# Patient Record
Sex: Male | Born: 1970 | Race: Black or African American | Hispanic: No | Marital: Married | Smoking: Current every day smoker
Health system: Southern US, Community
[De-identification: ages and names within clinical notes are randomized; demographics above are authoritative.]

## PROBLEM LIST (undated history)

## (undated) DIAGNOSIS — I10 Essential (primary) hypertension: Secondary | ICD-10-CM

## (undated) DIAGNOSIS — R7303 Prediabetes: Secondary | ICD-10-CM

## (undated) DIAGNOSIS — M51369 Other intervertebral disc degeneration, lumbar region without mention of lumbar back pain or lower extremity pain: Secondary | ICD-10-CM

## (undated) DIAGNOSIS — M1990 Unspecified osteoarthritis, unspecified site: Secondary | ICD-10-CM

## (undated) DIAGNOSIS — M5136 Other intervertebral disc degeneration, lumbar region: Secondary | ICD-10-CM

## (undated) DIAGNOSIS — E7800 Pure hypercholesterolemia, unspecified: Secondary | ICD-10-CM

## (undated) DIAGNOSIS — K635 Polyp of colon: Secondary | ICD-10-CM

## (undated) DIAGNOSIS — K219 Gastro-esophageal reflux disease without esophagitis: Secondary | ICD-10-CM

## (undated) HISTORY — PX: TOTAL HIP ARTHROPLASTY: SHX124

## (undated) HISTORY — PX: DRUG INDUCED ENDOSCOPY: SHX6808

## (undated) HISTORY — PX: COLONOSCOPY: SHX174

---

## 2019-01-31 ENCOUNTER — Emergency Department (HOSPITAL_COMMUNITY): Payer: Self-pay

## 2019-01-31 ENCOUNTER — Encounter (HOSPITAL_COMMUNITY): Payer: Self-pay

## 2019-01-31 ENCOUNTER — Emergency Department (HOSPITAL_COMMUNITY)
Admission: EM | Admit: 2019-01-31 | Discharge: 2019-01-31 | Disposition: A | Discharge status: Home / Self Care | Payer: Self-pay | Attending: Emergency Medicine | Admitting: Emergency Medicine

## 2019-01-31 ENCOUNTER — Other Ambulatory Visit: Payer: Self-pay

## 2019-01-31 DIAGNOSIS — Z8639 Personal history of other endocrine, nutritional and metabolic disease: Secondary | ICD-10-CM

## 2019-01-31 DIAGNOSIS — R0789 Other chest pain: Secondary | ICD-10-CM

## 2019-01-31 DIAGNOSIS — R072 Precordial pain: Secondary | ICD-10-CM

## 2019-01-31 LAB — CBC
HCT: 42.7 % (ref 39.0–52.0)
Hemoglobin: 14.4 g/dL (ref 13.0–17.0)
MCH: 32.5 pg (ref 26.0–34.0)
MCHC: 33.7 g/dL (ref 30.0–36.0)
MCV: 96.4 fL (ref 80.0–100.0)
Platelets: 150 10*3/uL (ref 150–400)
RBC: 4.43 MIL/uL (ref 4.22–5.81)
RDW: 13.2 % (ref 11.5–15.5)
WBC: 5 10*3/uL (ref 4.0–10.5)
nRBC: 0 % (ref 0.0–0.2)

## 2019-01-31 LAB — BASIC METABOLIC PANEL
Anion gap: 12 (ref 5–15)
BUN: 9 mg/dL (ref 6–20)
CO2: 22 mmol/L (ref 22–32)
CREATININE: 0.73 mg/dL (ref 0.61–1.24)
Calcium: 9.3 mg/dL (ref 8.9–10.3)
Chloride: 106 mmol/L (ref 98–111)
GFR calc Af Amer: >60 mL/min (ref >60)
GFR calc non Af Amer: >60 mL/min (ref >60)
Glucose, Bld: 100 mg/dL — ABNORMAL HIGH (ref 70–99)
Potassium: 3.7 mmol/L (ref 3.5–5.1)
SODIUM: 140 mmol/L (ref 135–145)

## 2019-01-31 LAB — I-STAT TROPONIN, ED: Troponin i, poc: 0 ng/mL (ref 0.00–0.08)

## 2019-01-31 MED ORDER — ALUM & MAG HYDROXIDE-SIMETH 200-200-20 MG/5ML PO SUSP
15.0000 mL | Freq: Once | ORAL | Status: AC
  Administered 2019-01-31: 15 mL via ORAL
  Filled 2019-01-31: qty 30

## 2019-01-31 MED ORDER — ACETAMINOPHEN 500 MG PO TABS
1000.0000 mg | ORAL_TABLET | Freq: Once | ORAL | Status: AC
  Administered 2019-01-31: 1000 mg via ORAL
  Filled 2019-01-31: qty 2

## 2019-01-31 MED ORDER — FAMOTIDINE 20 MG PO TABS
20.0000 mg | ORAL_TABLET | Freq: Once | ORAL | Status: AC
  Administered 2019-01-31: 20 mg via ORAL
  Filled 2019-01-31: qty 1

## 2019-01-31 MED ORDER — SODIUM CHLORIDE 0.9% FLUSH: 3.0000 mL | Freq: Once | INTRAVENOUS | Status: DC

## 2019-01-31 NOTE — Discharge Instructions (Addendum)
It was our pleasure to provide your ER care today - we hope that you feel better.  If gi symptoms/indigestion - try pepcid and maalox for symptom relief.  For chest discomfort, follow up with primary care doctor/cardiologist in the next 1-2 weeks - call office Monday to arrange follow up.  Return to ER if worse, new symptoms, high fevers, persistent/recurrent chest pain, difficulty breathing, other concern.

## 2019-01-31 NOTE — ED Provider Notes (Signed)
[facility] EMERGENCY DEPARTMENT Provider Note   CSN: [id number] Arrival date & time: 01/31/19  1615    History   Chief Complaint Chief Complaint  Patient presents with  . Chest Pain    HPI [facility] is a 48 y.o. male.     Patient c/o mid chest pain for the past 4 days. Pain constant, dull, moderate, gradual onset, constant, at rest, non radiating. Symptoms persistent/constant -  No change with activity or exertion. Not pleuritic. No associated nv, diaphoresis or sob. No cough or uri symptoms. No fever or chills. Denies personal or fam hx cad. No leg pain or swelling. +some indigestion. Denies unusual doe or fatigue. No chest wall injury or strain. No back or flank pain. No abd pain. No acute or abrupt change in pain today, but persistence of symptoms.   The history is provided by the patient.  Chest Pain  Associated symptoms: no abdominal pain, no back pain, no cough, no fever, no headache, no palpitations, no shortness of breath and no vomiting     Pmh: high cholesterol  fAm hxL: denies hx cad.   There are no active problems to display for this patient.   The histories are not reviewed yet. Please review them in the "History" navigator section and refresh this SmartLink.      Home Medications   Meds: none Prior to Admission medications   Not on File    Family History No family history on file.  Social History Social History   Tobacco Use  . Smoking status: Never Smoker  Substance Use Topics  . Alcohol use: Not on file  . Drug use: Not on file     Allergies   Tramadol   Review of Systems Review of Systems  Constitutional: Negative for fever.  HENT: Negative for sore throat.   Eyes: Negative for redness.  Respiratory: Negative for cough and shortness of breath.   Cardiovascular: Positive for chest pain. Negative for palpitations and leg swelling.  Gastrointestinal: Negative for abdominal pain and vomiting.  Genitourinary:  Negative for flank pain.  Musculoskeletal: Negative for back pain and neck pain.  Skin: Negative for rash.  Neurological: Negative for headaches.  Hematological: Does not bruise/bleed easily.  Psychiatric/Behavioral: Negative for confusion.     Physical Exam Updated Vital Signs BP 110/76 (BP Location: Right Arm)   Pulse 74   Temp 98.3 F (36.8 C) (Oral)   Resp 18   SpO2 99%   Physical Exam Vitals signs and nursing note reviewed.  Constitutional:      Appearance: Normal appearance. He is well-developed.  HENT:     Head: Atraumatic.     Nose: Nose normal.     Mouth/Throat:     Mouth: Mucous membranes are moist.     Pharynx: Oropharynx is clear.  Eyes:     General: No scleral icterus.    Conjunctiva/sclera: Conjunctivae normal.     Pupils: Pupils are equal, round, and reactive to light.  Neck:     Musculoskeletal: Normal range of motion and neck supple. No neck rigidity.     Trachea: No tracheal deviation.  Cardiovascular:     Rate and Rhythm: Normal rate and regular rhythm.     Pulses: Normal pulses.     Heart sounds: Normal heart sounds. No murmur. No friction rub. No gallop.   Pulmonary:     Effort: Pulmonary effort is normal. No accessory muscle usage or respiratory distress.     Breath sounds: Normal  breath sounds.  Chest:     Chest wall: No tenderness.  Abdominal:     General: Bowel sounds are normal. There is no distension.     Palpations: Abdomen is soft.     Tenderness: There is no abdominal tenderness. There is no guarding.  Genitourinary:    Comments: No cva tenderness. Musculoskeletal:        General: No swelling or tenderness.     Right lower leg: No edema.     Left lower leg: No edema.  Skin:    General: Skin is warm and dry.     Findings: No rash.  Neurological:     Mental Status: He is alert.     Comments: Alert, speech clear.   Psychiatric:        Mood and Affect: Mood normal.      ED Treatments / Results  Labs (all labs ordered are  listed, but only abnormal results are displayed) Results for orders placed or performed during the hospital encounter of 01/31/19  Basic metabolic panel  Result Value Ref Range   Sodium 140 135 - 145 mmol/L   Potassium 3.7 3.5 - 5.1 mmol/L   Chloride 106 98 - 111 mmol/L   CO2 22 22 - 32 mmol/L   Glucose, Bld 100 (H) 70 - 99 mg/dL   BUN 9 6 - 20 mg/dL   Creatinine, Ser 7.68 0.61 - 1.24 mg/dL   Calcium 9.3 8.9 - 11.5 mg/dL   GFR calc non Af Amer >60 >60 mL/min   GFR calc Af Amer >60 >60 mL/min   Anion gap 12 5 - 15  CBC  Result Value Ref Range   WBC 5.0 4.0 - 10.5 K/uL   RBC 4.43 4.22 - 5.81 MIL/uL   Hemoglobin 14.4 13.0 - 17.0 g/dL   HCT 72.6 20.3 - 55.9 %   MCV 96.4 80.0 - 100.0 fL   MCH 32.5 26.0 - 34.0 pg   MCHC 33.7 30.0 - 36.0 g/dL   RDW 74.1 63.8 - 45.3 %   Platelets 150 150 - 400 K/uL   nRBC 0.0 0.0 - 0.2 %  I-stat troponin, ED  Result Value Ref Range   Troponin i, poc 0.00 0.00 - 0.08 ng/mL   Comment 3           Dg Chest 2 View  Result Date: 01/31/2019 CLINICAL DATA:  Chest pain EXAM: CHEST - 2 VIEW COMPARISON:  None. FINDINGS: The heart size and mediastinal contours are within normal limits. Both lungs are clear. The visualized skeletal structures are unremarkable. IMPRESSION: No active cardiopulmonary disease. Electronically Signed   By: Deatra Robinson M.D.   On: 01/31/2019 17:01    ED ECG REPORT   Date: 01/31/2019  Rate: 77  Rhythm: normal sinus rhythm  QRS Axis: normal  Intervals: normal  ST/T Wave abnormalities: normal  Conduction Disutrbances:none  Narrative Interpretation:   Old EKG Reviewed: none available  I have personally reviewed the EKG tracing  Dg Chest 2 View  Result Date: 01/31/2019 CLINICAL DATA:  Chest pain EXAM: CHEST - 2 VIEW COMPARISON:  None. FINDINGS: The heart size and mediastinal contours are within normal limits. Both lungs are clear. The visualized skeletal structures are unremarkable. IMPRESSION: No active cardiopulmonary disease.  Electronically Signed   By: Deatra Robinson M.D.   On: 01/31/2019 17:01    Procedures Procedures (including critical care time)  Medications Ordered in ED Medications  sodium chloride flush (NS) 0.9 % injection 3 mL (has  no administration in time range)  famotidine (PEPCID) tablet 20 mg (has no administration in time range)  alum & mag hydroxide-simeth (MAALOX/MYLANTA) 200-200-20 MG/5ML suspension 15 mL (has no administration in time range)  acetaminophen (TYLENOL) tablet 1,000 mg (has no administration in time range)     Initial Impression / Assessment and Plan / ED Course  I have reviewed the triage vital signs and the nursing notes.  Pertinent labs & imaging results that were available during my care of the patient were reviewed by me and considered in my medical decision making (see chart for details).  Iabs. Ecg. Cxr.   Reviewed nursing notes and prior charts for additional history.   cxr reviewed - no pna.   Pt given pepcid, maalox and acetaminophen for symptom relief.  Labs reviewed - chem normal. Trop is normal - after 4 days constant symptoms, trop 0 - felt now c/w ACS.  Pt currently comfortable, no distress, and appears stable for d/c.  Pt reports outside labs previously being told chol elev. Given atypical chest pain, will have f/u pcp/cardiology as outpt, possible outpt stress test.   Return precautions provided.     Final Clinical Impressions(s) / ED Diagnoses   Final diagnoses:  None    ED Discharge Orders    None       Cathren Laine, MD 01/31/19 2033

## 2019-01-31 NOTE — ED Notes (Signed)
Patient verbalizes understanding of discharge instructions. Opportunity for questioning and answers were provided. Armband removed by staff, pt discharged from ED.  

## 2019-01-31 NOTE — ED Triage Notes (Signed)
[facility]- pt coming from home with c/o chest pain X4 days. Seen at [facility] 2 days ago and labs were unremarkable. Per PCP sent to ED for further eval.   119/75 86 16 95%

## 2019-10-07 IMAGING — CR DG CHEST 2V
2 series · 2 of 2 positions shown · non-contrast
Comparison: None.

CLINICAL DATA: Chest pain

EXAM:
CHEST - 2 VIEW

[chest pa]
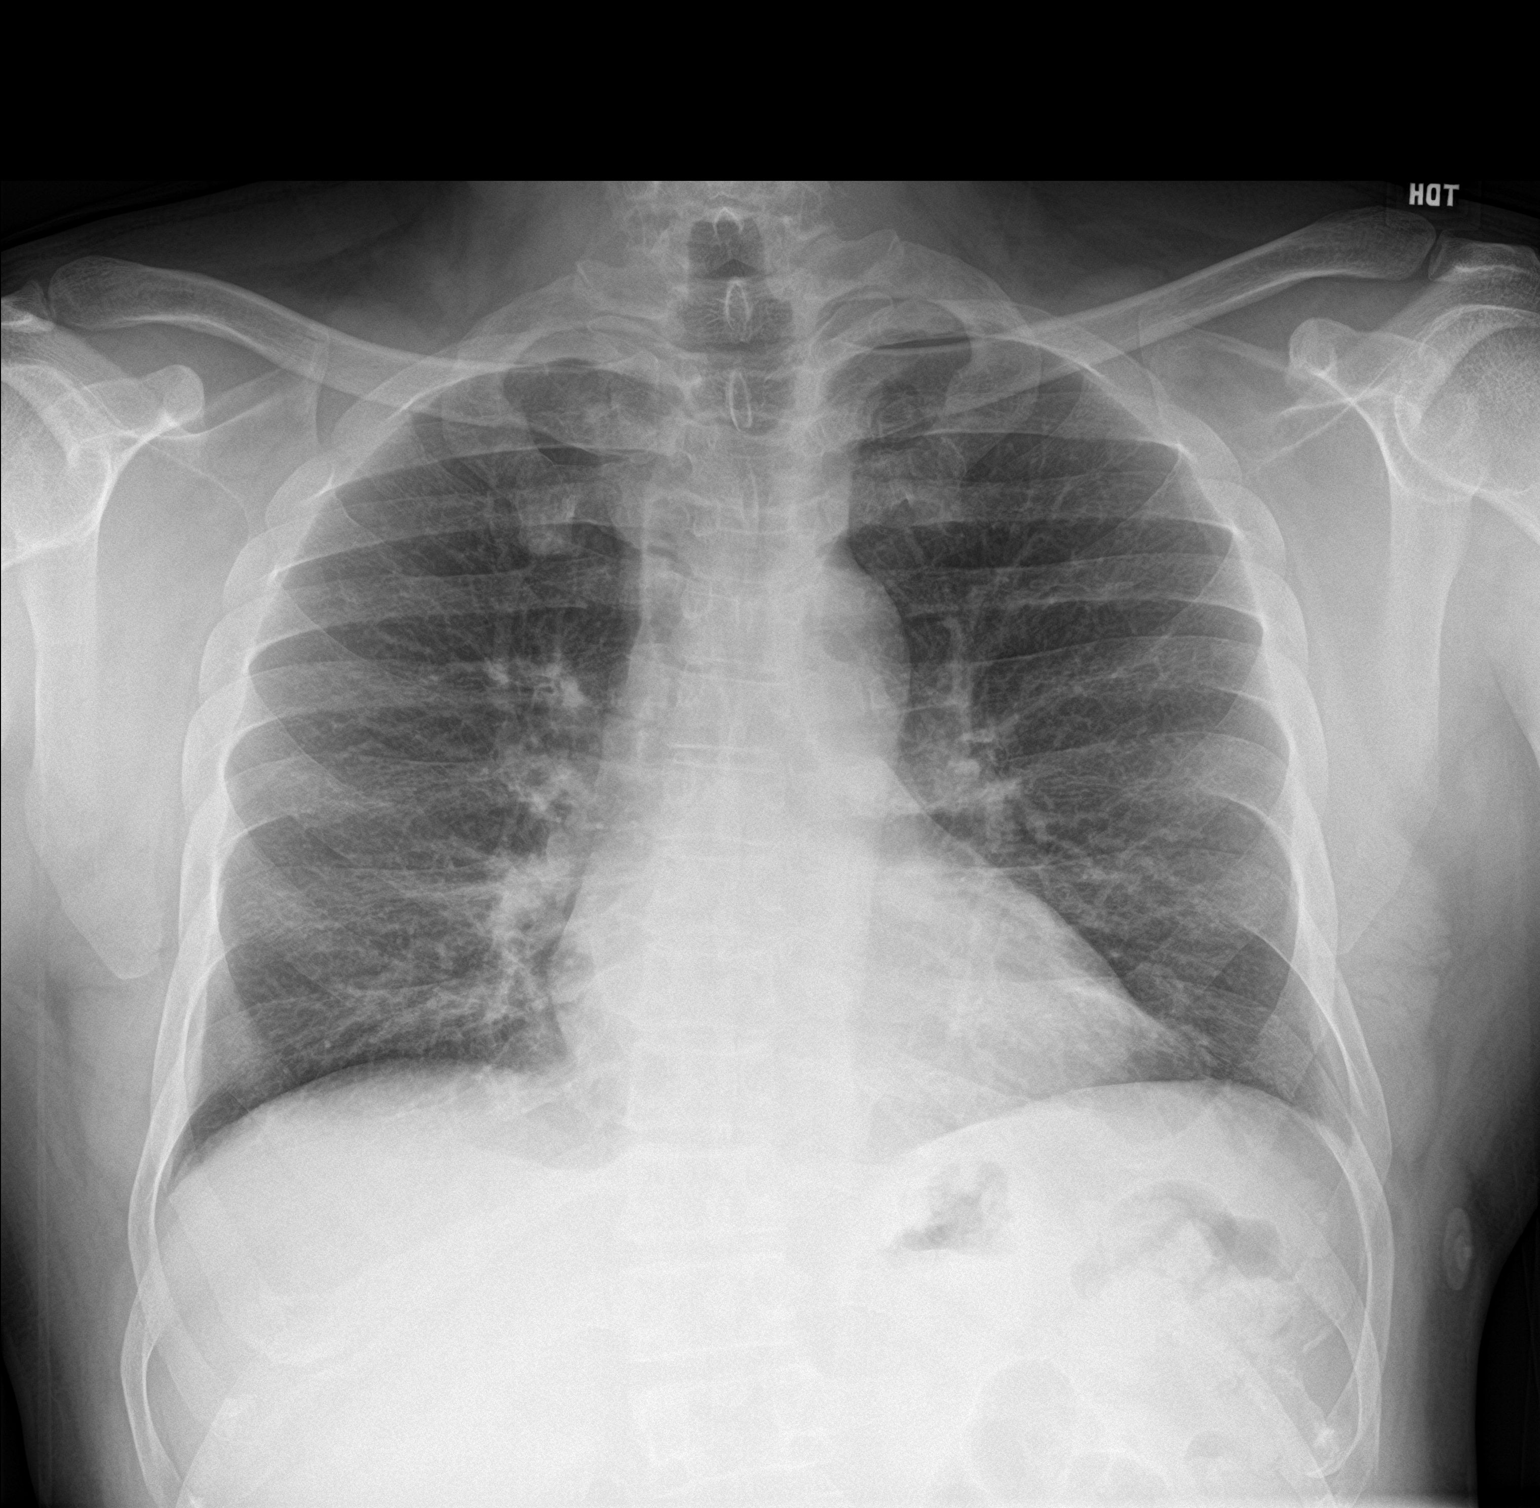

[chest lat]
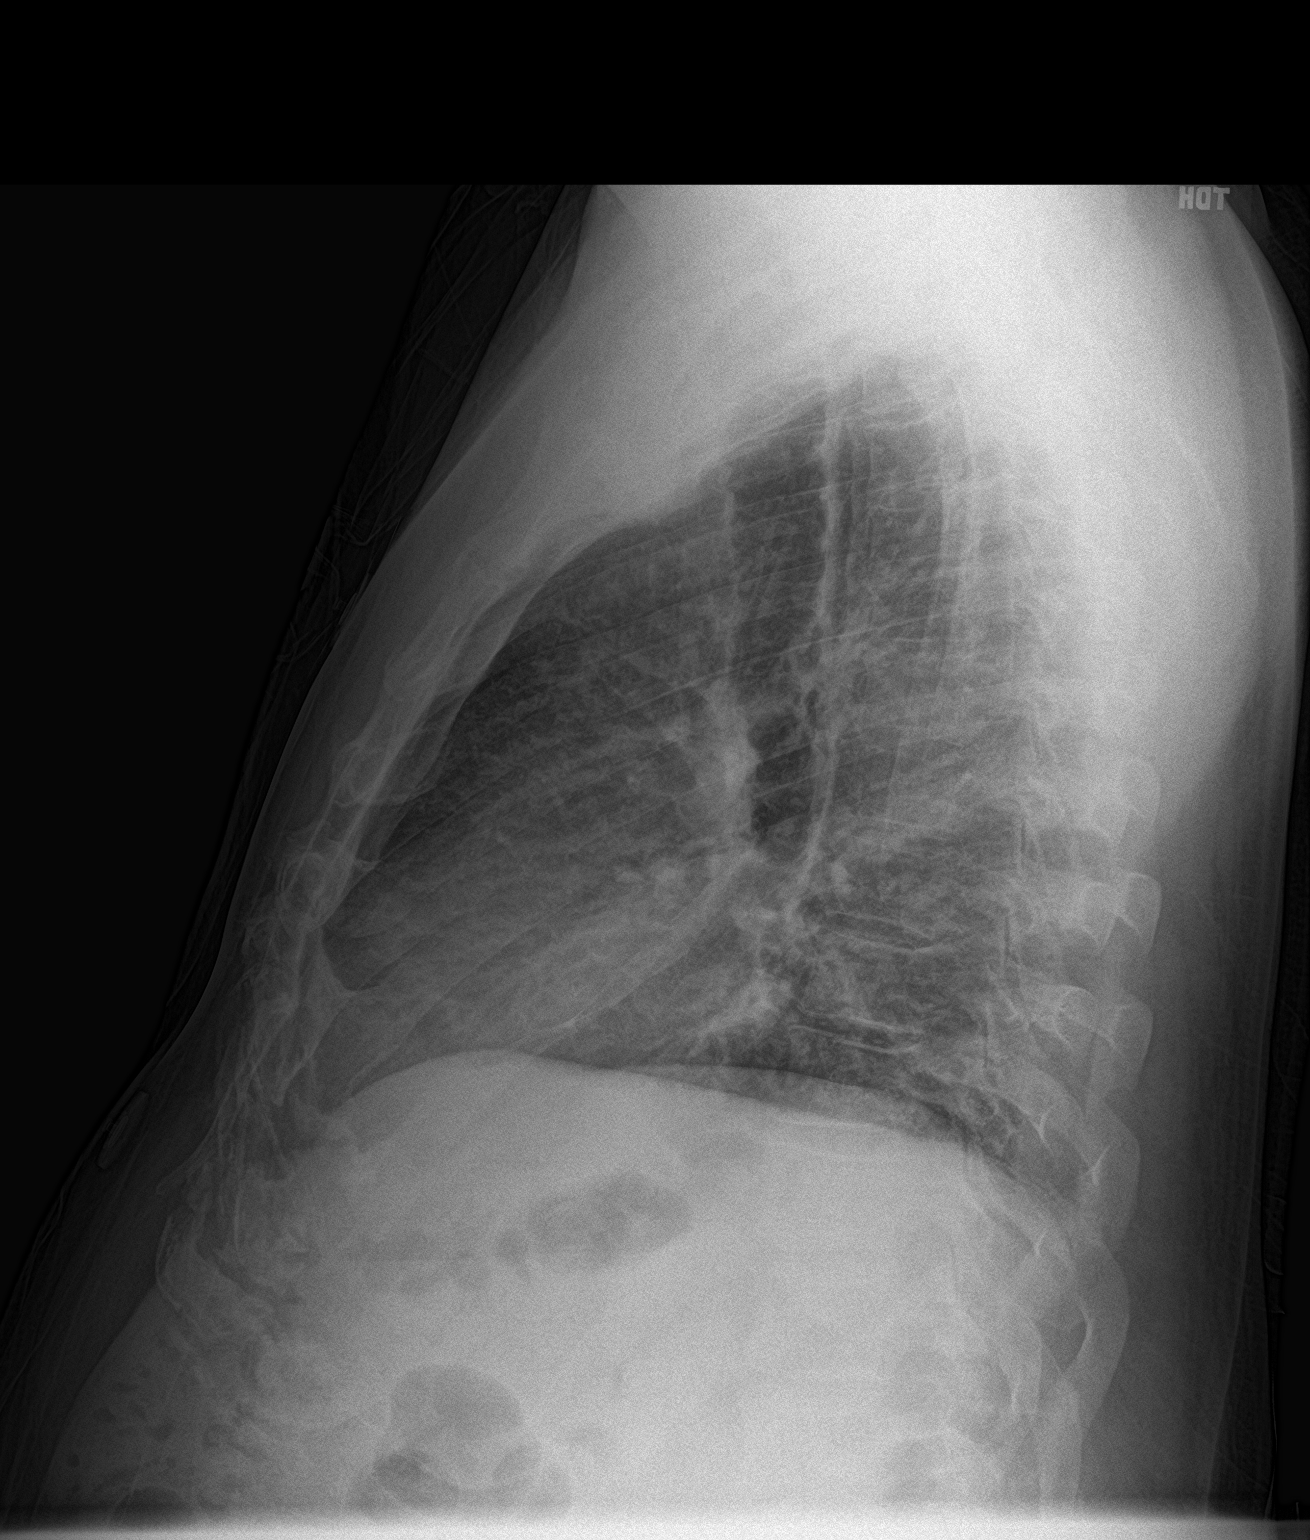

[2 of 2 positions shown; findings below may reference images not displayed]

FINDINGS: The heart size and mediastinal contours are within normal limits.
Both lungs are clear. The visualized skeletal structures are
unremarkable.
IMPRESSION: No active cardiopulmonary disease.

## 2020-04-02 ENCOUNTER — Encounter (HOSPITAL_COMMUNITY): Payer: Self-pay

## 2020-04-02 ENCOUNTER — Emergency Department (HOSPITAL_COMMUNITY): Payer: Self-pay

## 2020-04-02 ENCOUNTER — Emergency Department (HOSPITAL_COMMUNITY)
Admission: EM | Admit: 2020-04-02 | Discharge: 2020-04-02 | Disposition: A | Discharge status: Home / Self Care | Payer: Self-pay | Attending: Emergency Medicine | Admitting: Emergency Medicine

## 2020-04-02 ENCOUNTER — Other Ambulatory Visit: Payer: Self-pay

## 2020-04-02 DIAGNOSIS — M25551 Pain in right hip: Secondary | ICD-10-CM | POA: Insufficient documentation

## 2020-04-02 DIAGNOSIS — F17210 Nicotine dependence, cigarettes, uncomplicated: Secondary | ICD-10-CM | POA: Insufficient documentation

## 2020-04-02 DIAGNOSIS — N50811 Right testicular pain: Secondary | ICD-10-CM | POA: Insufficient documentation

## 2020-04-02 DIAGNOSIS — R1030 Lower abdominal pain, unspecified: Secondary | ICD-10-CM | POA: Insufficient documentation

## 2020-04-02 DIAGNOSIS — Z79899 Other long term (current) drug therapy: Secondary | ICD-10-CM | POA: Insufficient documentation

## 2020-04-02 DIAGNOSIS — I10 Essential (primary) hypertension: Secondary | ICD-10-CM | POA: Insufficient documentation

## 2020-04-02 DIAGNOSIS — M25559 Pain in unspecified hip: Secondary | ICD-10-CM

## 2020-04-02 HISTORY — DX: Unspecified osteoarthritis, unspecified site: M19.90

## 2020-04-02 HISTORY — DX: Other intervertebral disc degeneration, lumbar region without mention of lumbar back pain or lower extremity pain: M51.369

## 2020-04-02 HISTORY — DX: Essential (primary) hypertension: I10

## 2020-04-02 HISTORY — DX: Other intervertebral disc degeneration, lumbar region: M51.36

## 2020-04-02 LAB — URINALYSIS, ROUTINE W REFLEX MICROSCOPIC
Bilirubin Urine: NEGATIVE
Glucose, UA: NEGATIVE mg/dL
Hgb urine dipstick: NEGATIVE
Ketones, ur: NEGATIVE mg/dL
Leukocytes,Ua: NEGATIVE
Nitrite: NEGATIVE
Protein, ur: NEGATIVE mg/dL
Specific Gravity, Urine: 1.009 (ref 1.005–1.030)
pH: 7 (ref 5.0–8.0)

## 2020-04-02 LAB — BASIC METABOLIC PANEL
Anion gap: 5 (ref 5–15)
BUN: 12 mg/dL (ref 6–20)
CO2: 27 mmol/L (ref 22–32)
Calcium: 8.6 mg/dL — ABNORMAL LOW (ref 8.9–10.3)
Chloride: 106 mmol/L (ref 98–111)
Creatinine, Ser: 0.66 mg/dL (ref 0.61–1.24)
GFR calc Af Amer: >60 mL/min (ref >60)
GFR calc non Af Amer: >60 mL/min (ref >60)
Glucose, Bld: 87 mg/dL (ref 70–99)
Potassium: 4 mmol/L (ref 3.5–5.1)
Sodium: 138 mmol/L (ref 135–145)

## 2020-04-02 LAB — CBC WITH DIFFERENTIAL/PLATELET
Abs Immature Granulocytes: 0.02 10*3/uL (ref 0.00–0.07)
Basophils Absolute: 0.1 10*3/uL (ref 0.0–0.1)
Basophils Relative: 1 %
Eosinophils Absolute: 0.1 10*3/uL (ref 0.0–0.5)
Eosinophils Relative: 3 %
HCT: 42.2 % (ref 39.0–52.0)
Hemoglobin: 14 g/dL (ref 13.0–17.0)
Immature Granulocytes: 0 %
Lymphocytes Relative: 50 %
Lymphs Abs: 2.7 10*3/uL (ref 0.7–4.0)
MCH: 32.3 pg (ref 26.0–34.0)
MCHC: 33.2 g/dL (ref 30.0–36.0)
MCV: 97.5 fL (ref 80.0–100.0)
Monocytes Absolute: 0.4 10*3/uL (ref 0.1–1.0)
Monocytes Relative: 7 %
Neutro Abs: 2 10*3/uL (ref 1.7–7.7)
Neutrophils Relative %: 39 %
Platelets: 176 10*3/uL (ref 150–400)
RBC: 4.33 MIL/uL (ref 4.22–5.81)
RDW: 14.2 % (ref 11.5–15.5)
WBC: 5.3 10*3/uL (ref 4.0–10.5)
nRBC: 0 % (ref 0.0–0.2)

## 2020-04-02 MED ORDER — ETODOLAC 300 MG PO CAPS: 300.0000 mg | ORAL_CAPSULE | Freq: Three times a day (TID) | ORAL | 0 refills | Status: DC

## 2020-04-02 MED ORDER — KETOROLAC TROMETHAMINE 30 MG/ML IJ SOLN
30.0000 mg | Freq: Once | INTRAMUSCULAR | Status: AC
  Administered 2020-04-02: 30 mg via INTRAVENOUS
  Filled 2020-04-02: qty 1

## 2020-04-02 NOTE — ED Triage Notes (Signed)
Patient c/o right groin pain x 2 weeks. Patient denies any swelling at this time.

## 2020-04-02 NOTE — ED Provider Notes (Signed)
[facility]-EMERGENCY DEPT Provider Note   CSN: [id number] Arrival date & time: 04/02/20  1014     History Chief Complaint  Patient presents with  . Groin Pain    [facility] is a 49 y.o. male.  HPI   Patient presented to the ED for evaluation of right groin pain.  Patient states this started about 2 weeks ago.  Initially he noticed it more in the back of his right hip but now it is moved towards the front.  He has not noticed any swelling.  He denies any penile discharge.  No dysuria.  He denies any nausea or vomiting.  Denies any pain in his abdomen.  No diarrhea or constipation.  Patient states he had a hernia in his past and was concerned that he might have another one.  Past Medical History:  Diagnosis Date  . Arthritis   . DDD (degenerative disc disease), lumbar   . Hypertension     There are no problems to display for this patient.   Past Surgical History:  Procedure Laterality Date  . COLONOSCOPY    . DRUG INDUCED ENDOSCOPY         Family History  Problem Relation Age of Onset  . Diabetes Mother     Social History   Tobacco Use  . Smoking status: Current Every Day Smoker    Packs/day: 0.50    Types: Cigarettes  . Smokeless tobacco: Never Used  Substance Use Topics  . Alcohol use: Yes  . Drug use: Yes    Types: Marijuana    Home Medications Prior to Admission medications   Medication Sig Start Date End Date Taking? Authorizing Provider  cyclobenzaprine (FLEXERIL) 10 MG tablet Take 20 mg by mouth 3 (three) times daily.   Yes [provider]  diclofenac Sodium (VOLTAREN) 1 % GEL Apply 2 g topically 4 (four) times daily as needed (muscle pain).   Yes [provider]  gabapentin (NEURONTIN) 600 MG tablet Take 1,200 mg by mouth 3 (three) times daily.   Yes [provider]  meloxicam (MOBIC) 15 MG tablet Take 15 mg by mouth daily. 07/08/19  Yes [provider]  omeprazole (PRILOSEC) 40 MG capsule  Take 40 mg by mouth daily.   Yes [provider]  etodolac (LODINE) 300 MG capsule Take 1 capsule (300 mg total) by mouth every 8 (eight) hours. 04/02/20   Linwood Dibbles, MD    Allergies    Tramadol  Review of Systems   Review of Systems  All other systems reviewed and are negative.   Physical Exam Updated Vital Signs BP 121/88 (BP Location: Right Arm)   Pulse 86   Temp 98.3 F (36.8 C) (Oral)   Resp 18   Ht 1.772 m (5' 9.75")   Wt 89.6 kg   SpO2 100%   BMI 28.55 kg/m   Physical Exam Vitals and nursing note reviewed.  Constitutional:      General: He is not in acute distress.    Appearance: He is well-developed.  HENT:     Head: Normocephalic and atraumatic.     Right Ear: External ear normal.     Left Ear: External ear normal.  Eyes:     General: No scleral icterus.       Right eye: No discharge.        Left eye: No discharge.     Conjunctiva/sclera: Conjunctivae normal.  Neck:     Trachea: No tracheal deviation.  Cardiovascular:  Rate and Rhythm: Normal rate and regular rhythm.  Pulmonary:     Effort: Pulmonary effort is normal. No respiratory distress.     Breath sounds: Normal breath sounds. No stridor. No wheezing or rales.  Abdominal:     General: Bowel sounds are normal. There is no distension.     Palpations: Abdomen is soft.     Tenderness: There is no abdominal tenderness. There is no guarding or rebound.     Hernia: No hernia is present.  Genitourinary:    Penis: Normal.      Comments: Mild tenderness palpation right epididymis, no testicular mass, no hernia appreciated during Valsalva maneuver Musculoskeletal:        General: No tenderness.     Cervical back: Neck supple.  Skin:    General: Skin is warm and dry.     Findings: No rash.  Neurological:     Mental Status: He is alert.     Cranial Nerves: No cranial nerve deficit (no facial droop, extraocular movements intact, no slurred speech).     Sensory: No sensory deficit.     Motor:  No abnormal muscle tone or seizure activity.     Coordination: Coordination normal.     ED Results / Procedures / Treatments   Labs (all labs ordered are listed, but only abnormal results are displayed) Labs Reviewed  BASIC METABOLIC PANEL - Abnormal; Notable for the following components:      Result Value   Calcium 8.6 (*)    All other components within normal limits  URINALYSIS, ROUTINE W REFLEX MICROSCOPIC - Abnormal; Notable for the following components:   Color, Urine STRAW (*)    All other components within normal limits  CBC WITH DIFFERENTIAL/PLATELET    EKG None  Radiology No results found.  Procedures Procedures (including critical care time)  Medications Ordered in ED Medications  ketorolac (TORADOL) 30 MG/ML injection 30 mg (30 mg Intravenous Given 04/02/20 1224)    ED Course  I have reviewed the triage vital signs and the nursing notes.  Pertinent labs & imaging results that were available during my care of the patient were reviewed by me and considered in my medical decision making (see chart for details).  Clinical Course as of Apr 02 1458  Fri Apr 02, 2020  1407 Labs unremarkable.  UA pending   [JK]  1409 Ua negative    [JK]  1418 Discussed findings with patient.  He requests a hip xray before discharge.  Pain does increase with movement.  Will xray hip    [JK]  1453 Pt does not want to wait for his xray any longer.  He will go the [place] to make an appointment.   [JK]    Clinical Course User Index [JK] Linwood Dibbles, MD   MDM Rules/Calculators/A&P                      Pt presented with groin pain.  Initially suspected sx might be related to renal colic, epididymitis.  Labs and UA normal.  After discussion with patient, he felt it was more in his hip joint and not related to his kidney or testicle.  Pt requested a hip xray.  Xray ordered.  While waiting for xray pt decided he did not want to wait and would follow up at the [place].  Doubt acute infection.     Stable for outpt follow up.  Rx nsaids Final Clinical Impression(s) / ED Diagnoses Final diagnoses:  Hip  pain    Rx / DC Orders ED Discharge Orders         Ordered    etodolac (LODINE) 300 MG capsule  Every 8 hours    Note to Pharmacy: As needed for pain   04/02/20 1455           Dorie Rank, MD 04/02/20 1459

## 2020-04-02 NOTE — ED Notes (Signed)
Patient removed IV. Patient states "I am leaving, it is taking too long, I do not want another bill, and can get everything done at the [facility] for free." MD made aware.

## 2020-04-02 NOTE — ED Notes (Signed)
Patient left prior to receiving d/c paperwork. 

## 2020-04-02 NOTE — ED Notes (Signed)
Patient provided with urinal and made aware urine sample is needed. 

## 2020-04-02 NOTE — Discharge Instructions (Addendum)
Take the medications as needed for pain.  [facility].

## 2020-10-09 ENCOUNTER — Emergency Department (HOSPITAL_COMMUNITY): Payer: Self-pay

## 2020-10-09 ENCOUNTER — Emergency Department (HOSPITAL_COMMUNITY)
Admission: EM | Admit: 2020-10-09 | Discharge: 2020-10-09 | Disposition: A | Discharge status: Home / Self Care | Payer: Self-pay | Attending: Emergency Medicine | Admitting: Emergency Medicine

## 2020-10-09 ENCOUNTER — Encounter (HOSPITAL_COMMUNITY): Payer: Self-pay | Admitting: Emergency Medicine

## 2020-10-09 ENCOUNTER — Other Ambulatory Visit: Payer: Self-pay

## 2020-10-09 DIAGNOSIS — I10 Essential (primary) hypertension: Secondary | ICD-10-CM | POA: Insufficient documentation

## 2020-10-09 DIAGNOSIS — Z79899 Other long term (current) drug therapy: Secondary | ICD-10-CM | POA: Insufficient documentation

## 2020-10-09 DIAGNOSIS — Z20822 Contact with and (suspected) exposure to covid-19: Secondary | ICD-10-CM | POA: Insufficient documentation

## 2020-10-09 DIAGNOSIS — F17210 Nicotine dependence, cigarettes, uncomplicated: Secondary | ICD-10-CM | POA: Insufficient documentation

## 2020-10-09 DIAGNOSIS — R059 Cough, unspecified: Secondary | ICD-10-CM | POA: Insufficient documentation

## 2020-10-09 LAB — COMPREHENSIVE METABOLIC PANEL
ALT: 24 U/L (ref 0–44)
AST: 20 U/L (ref 15–41)
Albumin: 3.7 g/dL (ref 3.5–5.0)
Alkaline Phosphatase: 76 U/L (ref 38–126)
Anion gap: 10 (ref 5–15)
BUN: 11 mg/dL (ref 6–20)
CO2: 22 mmol/L (ref 22–32)
Calcium: 8.7 mg/dL — ABNORMAL LOW (ref 8.9–10.3)
Chloride: 105 mmol/L (ref 98–111)
Creatinine, Ser: 0.79 mg/dL (ref 0.61–1.24)
GFR, Estimated: >60 mL/min (ref >60)
Glucose, Bld: 82 mg/dL (ref 70–99)
Potassium: 4.2 mmol/L (ref 3.5–5.1)
Sodium: 137 mmol/L (ref 135–145)
Total Bilirubin: 0.7 mg/dL (ref 0.3–1.2)
Total Protein: 6.3 g/dL — ABNORMAL LOW (ref 6.5–8.1)

## 2020-10-09 LAB — CBC WITH DIFFERENTIAL/PLATELET
Abs Immature Granulocytes: 0.02 10*3/uL (ref 0.00–0.07)
Basophils Absolute: 0.1 10*3/uL (ref 0.0–0.1)
Basophils Relative: 1 %
Eosinophils Absolute: 0.2 10*3/uL (ref 0.0–0.5)
Eosinophils Relative: 2 %
HCT: 47.8 % (ref 39.0–52.0)
Hemoglobin: 15.8 g/dL (ref 13.0–17.0)
Immature Granulocytes: 0 %
Lymphocytes Relative: 25 %
Lymphs Abs: 2.6 10*3/uL (ref 0.7–4.0)
MCH: 32 pg (ref 26.0–34.0)
MCHC: 33.1 g/dL (ref 30.0–36.0)
MCV: 97 fL (ref 80.0–100.0)
Monocytes Absolute: 0.6 10*3/uL (ref 0.1–1.0)
Monocytes Relative: 6 %
Neutro Abs: 6.9 10*3/uL (ref 1.7–7.7)
Neutrophils Relative %: 66 %
Platelets: 152 10*3/uL (ref 150–400)
RBC: 4.93 MIL/uL (ref 4.22–5.81)
RDW: 14.1 % (ref 11.5–15.5)
WBC: 10.4 10*3/uL (ref 4.0–10.5)
nRBC: 0 % (ref 0.0–0.2)

## 2020-10-09 LAB — RESPIRATORY PANEL BY RT PCR (FLU A&B, COVID)
Influenza A by PCR: NEGATIVE
Influenza B by PCR: NEGATIVE
SARS Coronavirus 2 by RT PCR: NEGATIVE

## 2020-10-09 MED ORDER — ACETAMINOPHEN 325 MG PO TABS
650.0000 mg | ORAL_TABLET | Freq: Once | ORAL | Status: AC
  Administered 2020-10-09: 650 mg via ORAL
  Filled 2020-10-09: qty 2

## 2020-10-09 MED ORDER — DEXAMETHASONE 4 MG PO TABS
10.0000 mg | ORAL_TABLET | Freq: Once | ORAL | Status: AC
  Administered 2020-10-09: 10 mg via ORAL
  Filled 2020-10-09: qty 3

## 2020-10-09 NOTE — Discharge Instructions (Signed)
Covid test is negative. However suspect some other type of viral process. Use your inhaler as needed. Follow-up with your primary care doctor.

## 2020-10-09 NOTE — ED Provider Notes (Signed)
[facility] EMERGENCY DEPARTMENT Provider Note   CSN: [id number] Arrival date & time: 10/09/20  1010     History Chief Complaint  Patient presents with  . Nasal Congestion  . Cough  . Chills    [facility] is a 49 y.o. male.   Cough Cough characteristics:  Non-productive Sputum characteristics:  Nondescript Severity:  Mild Onset quality:  Gradual Timing:  Intermittent Progression:  Waxing and waning Chronicity:  New Smoker: yes   Context: upper respiratory infection   Relieved by:  Nothing Worsened by:  Nothing Associated symptoms: sinus congestion   Associated symptoms: no chest pain, no chills, no diaphoresis, no ear pain, no fever, no rash, no shortness of breath and no sore throat        Past Medical History:  Diagnosis Date  . Arthritis   . DDD (degenerative disc disease), lumbar   . Hypertension     There are no problems to display for this patient.   Past Surgical History:  Procedure Laterality Date  . COLONOSCOPY    . DRUG INDUCED ENDOSCOPY         Family History  Problem Relation Age of Onset  . Diabetes Mother     Social History   Tobacco Use  . Smoking status: Current Every Day Smoker    Packs/day: 0.50    Types: Cigarettes  . Smokeless tobacco: Never Used  Vaping Use  . Vaping Use: Never used  Substance Use Topics  . Alcohol use: Yes  . Drug use: Yes    Types: Marijuana    Home Medications Prior to Admission medications   Medication Sig Start Date End Date Taking? Authorizing Provider  cyclobenzaprine (FLEXERIL) 10 MG tablet Take 20 mg by mouth 3 (three) times daily.    [provider]  diclofenac Sodium (VOLTAREN) 1 % GEL Apply 2 g topically 4 (four) times daily as needed (muscle pain).    [provider]  etodolac (LODINE) 300 MG capsule Take 1 capsule (300 mg total) by mouth every 8 (eight) hours. 04/02/20   Linwood Dibbles, MD  gabapentin (NEURONTIN) 600 MG tablet Take 1,200 mg by mouth 3  (three) times daily.    [provider]  meloxicam (MOBIC) 15 MG tablet Take 15 mg by mouth daily. 07/08/19   [provider]  omeprazole (PRILOSEC) 40 MG capsule Take 40 mg by mouth daily.    [provider]    Allergies    Tramadol  Review of Systems   Review of Systems  Constitutional: Negative for chills, diaphoresis and fever.  HENT: Positive for congestion. Negative for ear pain and sore throat.   Eyes: Negative for pain and visual disturbance.  Respiratory: Positive for cough. Negative for shortness of breath.   Cardiovascular: Negative for chest pain and palpitations.  Gastrointestinal: Negative for abdominal pain and vomiting.  Genitourinary: Negative for dysuria and hematuria.  Musculoskeletal: Negative for arthralgias and back pain.  Skin: Negative for color change and rash.  Neurological: Negative for seizures and syncope.  All other systems reviewed and are negative.   Physical Exam Updated Vital Signs  ED Triage Vitals [10/09/20 1026]  Enc Vitals Group     BP 114/83     Pulse Rate 90     Resp 16     Temp 99.1 F (37.3 C)     Temp Source Oral     SpO2 100 %     Weight 193 lb (87.5 kg)  Height 5' 9.75" (1.772 m)     Head Circumference      Peak Flow      Pain Score      Pain Loc      Pain Edu?      Excl. in GC?     Physical Exam Vitals and nursing note reviewed.  Constitutional:      General: He is not in acute distress.    Appearance: He is well-developed. He is not ill-appearing.  HENT:     Head: Normocephalic and atraumatic.     Nose: Congestion present.     Mouth/Throat:     Mouth: Mucous membranes are moist.  Eyes:     Extraocular Movements: Extraocular movements intact.     Conjunctiva/sclera: Conjunctivae normal.     Pupils: Pupils are equal, round, and reactive to light.  Cardiovascular:     Rate and Rhythm: Normal rate and regular rhythm.     Pulses: Normal pulses.     Heart sounds: No murmur heard.    Pulmonary:     Effort: Pulmonary effort is normal. No respiratory distress.     Breath sounds: Normal breath sounds.  Abdominal:     Palpations: Abdomen is soft.     Tenderness: There is no abdominal tenderness.  Musculoskeletal:     Cervical back: Normal range of motion and neck supple.  Skin:    General: Skin is warm and dry.     Capillary Refill: Capillary refill takes less than 2 seconds.  Neurological:     General: No focal deficit present.     Mental Status: He is alert.     ED Results / Procedures / Treatments   Labs (all labs ordered are listed, but only abnormal results are displayed) Labs Reviewed  COMPREHENSIVE METABOLIC PANEL - Abnormal; Notable for the following components:      Result Value   Calcium 8.7 (*)    Total Protein 6.3 (*)    All other components within normal limits  RESPIRATORY PANEL BY RT PCR (FLU A&B, COVID)  CBC WITH DIFFERENTIAL/PLATELET    EKG None  Radiology DG Chest Port 1 View  Result Date: 10/09/2020 CLINICAL DATA:  Left-sided chest pain and cough for 2 days. EXAM: PORTABLE CHEST 1 VIEW COMPARISON:  01/31/2019 FINDINGS: The heart size and mediastinal contours are within normal limits. Both lungs are clear. The visualized skeletal structures are unremarkable. IMPRESSION: No active disease. Electronically Signed   By: Danae Orleans M.D.   On: 10/09/2020 11:38    Procedures Procedures (including critical care time)  Medications Ordered in ED Medications  acetaminophen (TYLENOL) tablet 650 mg (has no administration in time range)  dexamethasone (DECADRON) tablet 10 mg (has no administration in time range)    ED Course  I have reviewed the triage vital signs and the nursing notes.  Pertinent labs & imaging results that were available during my care of the patient were reviewed by me and considered in my medical decision making (see chart for details).    MDM Rules/Calculators/A&P                          [facility] is a  49 year old male with history of hypertension, possibly reactive airway disease who presents to the ED with nasal congestion, cough.  Symptoms for the last several days.  Is vaccinated against Covid.  Chest x-ray has already been performed that shows no pneumonia, no pneumothorax.  Patient with no  significant anemia, electrolyte abnormality, kidney injury.  No chest pain and no cardiac symptoms.  Overall appears to have a viral process.  May be some reactive airway disease.  Has been using an inhaler at home with some minimal relief.  Will prescribe Decadron.  Will swab for Covid and influenza.  Normal room air oxygenation and no signs of respiratory distress.  Overall appears safe for discharge.  Understands return precautions and discharged from ED in good condition.  This chart was dictated using voice recognition software.  Despite best efforts to proofread,  errors can occur which can change the documentation meaning.  [facility] was evaluated in Emergency Department on 10/09/2020 for the symptoms described in the history of present illness. He was evaluated in the context of the global COVID-19 pandemic, which necessitated consideration that the patient might be at risk for infection with the SARS-CoV-2 virus that causes COVID-19. Institutional protocols and algorithms that pertain to the evaluation of patients at risk for COVID-19 are in a state of rapid change based on information released by regulatory bodies including the CDC and federal and state organizations. These policies and algorithms were followed during the patient's care in the ED.   Final Clinical Impression(s) / ED Diagnoses Final diagnoses:  Cough    Rx / DC Orders ED Discharge Orders    None       Virgina Norfolk, DO 10/09/20 1302

## 2020-10-09 NOTE — ED Triage Notes (Signed)
Pt. Stated, [facility] had a bad cold with congestion, cough and sometimes I can't get warm I would be freezing.

## 2021-01-23 ENCOUNTER — Other Ambulatory Visit: Payer: Self-pay

## 2021-01-23 ENCOUNTER — Emergency Department (HOSPITAL_COMMUNITY)
Admission: EM | Admit: 2021-01-23 | Discharge: 2021-01-24 | Disposition: A | Discharge status: Home / Self Care | Payer: Self-pay | Attending: Emergency Medicine | Admitting: Emergency Medicine

## 2021-01-23 ENCOUNTER — Emergency Department (HOSPITAL_COMMUNITY): Payer: Self-pay

## 2021-01-23 ENCOUNTER — Encounter (HOSPITAL_COMMUNITY): Payer: Self-pay | Admitting: Emergency Medicine

## 2021-01-23 DIAGNOSIS — F17210 Nicotine dependence, cigarettes, uncomplicated: Secondary | ICD-10-CM | POA: Insufficient documentation

## 2021-01-23 DIAGNOSIS — I10 Essential (primary) hypertension: Secondary | ICD-10-CM | POA: Insufficient documentation

## 2021-01-23 DIAGNOSIS — R112 Nausea with vomiting, unspecified: Secondary | ICD-10-CM | POA: Insufficient documentation

## 2021-01-23 DIAGNOSIS — R079 Chest pain, unspecified: Secondary | ICD-10-CM | POA: Insufficient documentation

## 2021-01-23 DIAGNOSIS — R1013 Epigastric pain: Secondary | ICD-10-CM | POA: Insufficient documentation

## 2021-01-23 LAB — URINALYSIS, ROUTINE W REFLEX MICROSCOPIC
Bilirubin Urine: NEGATIVE
Glucose, UA: NEGATIVE mg/dL
Hgb urine dipstick: NEGATIVE
Ketones, ur: NEGATIVE mg/dL
Leukocytes,Ua: NEGATIVE
Nitrite: NEGATIVE
Protein, ur: NEGATIVE mg/dL
Specific Gravity, Urine: 1.017 (ref 1.005–1.030)
pH: 5 (ref 5.0–8.0)

## 2021-01-23 LAB — CBC
HCT: 45.1 % (ref 39.0–52.0)
Hemoglobin: 14.9 g/dL (ref 13.0–17.0)
MCH: 31.8 pg (ref 26.0–34.0)
MCHC: 33 g/dL (ref 30.0–36.0)
MCV: 96.2 fL (ref 80.0–100.0)
Platelets: 180 10*3/uL (ref 150–400)
RBC: 4.69 MIL/uL (ref 4.22–5.81)
RDW: 13.7 % (ref 11.5–15.5)
WBC: 5.8 10*3/uL (ref 4.0–10.5)
nRBC: 0 % (ref 0.0–0.2)

## 2021-01-23 LAB — LIPASE, BLOOD: Lipase: 49 U/L (ref 11–51)

## 2021-01-23 LAB — COMPREHENSIVE METABOLIC PANEL
ALT: 29 U/L (ref 0–44)
AST: 21 U/L (ref 15–41)
Albumin: 3.3 g/dL — ABNORMAL LOW (ref 3.5–5.0)
Alkaline Phosphatase: 57 U/L (ref 38–126)
Anion gap: 9 (ref 5–15)
BUN: 7 mg/dL (ref 6–20)
CO2: 27 mmol/L (ref 22–32)
Calcium: 8.5 mg/dL — ABNORMAL LOW (ref 8.9–10.3)
Chloride: 103 mmol/L (ref 98–111)
Creatinine, Ser: 0.79 mg/dL (ref 0.61–1.24)
GFR, Estimated: >60 mL/min (ref >60)
Glucose, Bld: 132 mg/dL — ABNORMAL HIGH (ref 70–99)
Potassium: 3.2 mmol/L — ABNORMAL LOW (ref 3.5–5.1)
Sodium: 139 mmol/L (ref 135–145)
Total Bilirubin: 0.4 mg/dL (ref 0.3–1.2)
Total Protein: 5.5 g/dL — ABNORMAL LOW (ref 6.5–8.1)

## 2021-01-23 LAB — TROPONIN I (HIGH SENSITIVITY): Troponin I (High Sensitivity): <2 ng/L (ref <18)

## 2021-01-23 MED ORDER — ALUM & MAG HYDROXIDE-SIMETH 200-200-20 MG/5ML PO SUSP
30.0000 mL | Freq: Once | ORAL | Status: AC
  Administered 2021-01-24: 30 mL via ORAL
  Filled 2021-01-23: qty 30

## 2021-01-23 MED ORDER — LIDOCAINE VISCOUS HCL 2 % MT SOLN
15.0000 mL | Freq: Once | OROMUCOSAL | Status: AC
  Administered 2021-01-24: 15 mL via ORAL
  Filled 2021-01-23: qty 15

## 2021-01-23 MED ORDER — ONDANSETRON 4 MG PO TBDP
4.0000 mg | ORAL_TABLET | Freq: Once | ORAL | Status: AC
  Administered 2021-01-24: 4 mg via ORAL
  Filled 2021-01-23: qty 1

## 2021-01-23 MED ORDER — FAMOTIDINE 20 MG PO TABS: 20.0000 mg | ORAL_TABLET | Freq: Two times a day (BID) | ORAL | 0 refills | Status: DC

## 2021-01-23 MED ORDER — ONDANSETRON 4 MG PO TBDP: 4.0000 mg | ORAL_TABLET | Freq: Three times a day (TID) | ORAL | 0 refills | Status: DC | PRN

## 2021-01-23 NOTE — Discharge Instructions (Signed)
Please read and follow all provided instructions.  Your diagnoses today include:  1. Non-intractable vomiting with nausea, unspecified vomiting type   2. Epigastric pain     Tests performed today include:  Blood cell counts and platelets -normal white blood cell count   Kidney and liver function tests  Pancreas function test (called lipase) -was normal  Urine test to look for infection  EKG - no changes  Chest x-ray - is clear  Cardiac enzyme -does not show any signs of stress on the heart today  Vital signs. See below for your results today.   Medications prescribed:   Zofran (ondansetron) - for nausea and vomiting   Pepcid (famotidine) - antihistamine  You can find this medication over-the-counter.   DO NOT exceed:   20mg  Pepcid every 12 hours  Take any prescribed medications only as directed.  Home care instructions:   Follow any educational materials contained in this packet.  Follow-up instructions: Please follow-up with your primary care provider in the next 3 days for further evaluation of your symptoms.    Return instructions:  SEEK IMMEDIATE MEDICAL ATTENTION IF:  The pain does not go away or becomes severe   A temperature above 101F develops   Repeated vomiting occurs (multiple episodes)   The pain becomes localized to portions of the abdomen. The right side could possibly be appendicitis. In an adult, the left lower portion of the abdomen could be colitis or diverticulitis.   Blood is being passed in stools or vomit (bright red or black tarry stools)   You develop chest pain, difficulty breathing, dizziness or fainting, or become confused, poorly responsive, or inconsolable (young children)  If you have any other emergent concerns regarding your health  Additional Information: Abdominal (belly) pain can be caused by many things. Your caregiver performed an examination and possibly ordered blood/urine tests and imaging (CT scan, x-rays,  ultrasound). Many cases can be observed and treated at home after initial evaluation in the emergency department. Even though you are being discharged home, abdominal pain can be unpredictable. Therefore, you need a repeated exam if your pain does not resolve, returns, or worsens. Most patients with abdominal pain don't have to be admitted to the hospital or have surgery, but serious problems like appendicitis and gallbladder attacks can start out as nonspecific pain. Many abdominal conditions cannot be diagnosed in one visit, so follow-up evaluations are very important.  Your vital signs today were: BP 102/76   Pulse 71   Temp 98 F (36.7 C) (Oral)   Resp 13   SpO2 97%  If your blood pressure (bp) was elevated above 135/85 this visit, please have this repeated by your doctor within one month. --------------

## 2021-01-23 NOTE — ED Triage Notes (Signed)
Pt BIB [facility] from home, c/o abdominal tenderness, nausea and vomiting after taking his daily medications today. Pt also reports ETOH use today as well. Given 4mg  zofran by EMS.

## 2021-01-23 NOTE — ED Provider Notes (Signed)
[facility] EMERGENCY DEPARTMENT Provider Note   CSN: [id number] Arrival date & time: 01/23/21  1615     History Chief Complaint  Patient presents with  . Emesis    Don Washington is a 50 y.o. male.  Patient presents the emergency department for evaluation of nausea and vomiting as well as intermittent chest pains and abdominal pain, starting around 3 PM today.  Patient was given Zofran by EMS.  Last vomiting episode just prior to arrival.  Patient states he was getting ready for work when he developed epigastric pain and vomiting.  No diarrhea.  Vomiting was nonbloody, nonbilious.  Pain did not radiate.  Patient takes meloxicam daily, denies other NSAIDs.  He was drinking beer about an hour prior to symptoms starting today.  He states that he is scheduled for a stress test next week due to intermittent chest pains.  Chest pain today is described as sharp in the mid chest, fleeting lasting only a few seconds.  No radiation of the chest pain.  No history of coronary artery disease.  No history of abdominal surgeries.          Past Medical History:  Diagnosis Date  . Arthritis   . DDD (degenerative disc disease), lumbar   . Hypertension     There are no problems to display for this patient.   Past Surgical History:  Procedure Laterality Date  . COLONOSCOPY    . DRUG INDUCED ENDOSCOPY         Family History  Problem Relation Age of Onset  . Diabetes Mother     Social History   Tobacco Use  . Smoking status: Current Every Day Smoker    Packs/day: 0.50    Types: Cigarettes  . Smokeless tobacco: Never Used  Vaping Use  . Vaping Use: Never used  Substance Use Topics  . Alcohol use: Yes  . Drug use: Yes    Types: Marijuana    Home Medications Prior to Admission medications   Medication Sig Start Date End Date Taking? Authorizing Provider  cyclobenzaprine (FLEXERIL) 10 MG tablet Take 20 mg by mouth 3 (three) times daily.    [provider]  diclofenac Sodium (VOLTAREN) 1 % GEL Apply 2 g topically 4 (four) times daily as needed (muscle pain).    [provider]  etodolac (LODINE) 300 MG capsule Take 1 capsule (300 mg total) by mouth every 8 (eight) hours. 04/02/20   Linwood Dibbles, MD  gabapentin (NEURONTIN) 600 MG tablet Take 1,200 mg by mouth 3 (three) times daily.    [provider]  meloxicam (MOBIC) 15 MG tablet Take 15 mg by mouth daily. 07/08/19   [provider]  omeprazole (PRILOSEC) 40 MG capsule Take 40 mg by mouth daily.    [provider]    Allergies    Tramadol  Review of Systems   Review of Systems  Constitutional: Negative for fever.  HENT: Negative for rhinorrhea and sore throat.   Eyes: Negative for redness.  Respiratory: Negative for cough and shortness of breath.   Cardiovascular: Positive for chest pain.  Gastrointestinal: Positive for abdominal pain, nausea and vomiting. Negative for diarrhea.  Genitourinary: Negative for dysuria and hematuria.  Musculoskeletal: Negative for myalgias.  Skin: Negative for rash.  Neurological: Negative for headaches.    Physical Exam Updated Vital Signs BP 108/73 (BP Location: Right Arm)   Pulse 85   Temp 98 F (36.7 C) (Oral)   Resp 18  SpO2 98%   Physical Exam Vitals and nursing note reviewed.  Constitutional:      Appearance: He is well-developed and well-nourished.  HENT:     Head: Normocephalic and atraumatic.  Eyes:     General:        Right eye: No discharge.        Left eye: No discharge.     Conjunctiva/sclera: Conjunctivae normal.  Cardiovascular:     Rate and Rhythm: Normal rate and regular rhythm.     Heart sounds: Normal heart sounds.  Pulmonary:     Effort: Pulmonary effort is normal.     Breath sounds: Normal breath sounds.  Abdominal:     Palpations: Abdomen is soft.     Tenderness: There is abdominal tenderness. There is no guarding or rebound.     Comments: Minimal, upper abdomen   Musculoskeletal:     Cervical back: Normal range of motion and neck supple.  Skin:    General: Skin is warm and dry.  Neurological:     Mental Status: He is alert.  Psychiatric:        Mood and Affect: Mood and affect normal.     ED Results / Procedures / Treatments   Labs (all labs ordered are listed, but only abnormal results are displayed) Labs Reviewed  COMPREHENSIVE METABOLIC PANEL - Abnormal; Notable for the following components:      Result Value   Potassium 3.2 (*)    Glucose, Bld 132 (*)    Calcium 8.5 (*)    Total Protein 5.5 (*)    Albumin 3.3 (*)    All other components within normal limits  LIPASE, BLOOD  CBC  URINALYSIS, ROUTINE W REFLEX MICROSCOPIC  TROPONIN I (HIGH SENSITIVITY)    EKG EKG Interpretation  Date/Time:  Sunday January 23 2021 16:18:03 EST Ventricular Rate:  84 PR Interval:  198 QRS Duration: 90 QT Interval:  376 QTC Calculation: 444 R Axis:   10 Text Interpretation: Normal sinus rhythm Possible Left atrial enlargement Borderline ECG No significant change since last tracing Confirmed by Melene Plan [phone]) on 01/23/2021 6:44:31 PM   Radiology DG Chest 2 View  Result Date: 01/23/2021 CLINICAL DATA:  Chest pain a. nausea and vomiting. Abdominal tenderness. EXAM: CHEST - 2 VIEW COMPARISON:  10/09/2020 FINDINGS: The heart size and mediastinal contours are within normal limits. Both lungs are clear. The visualized skeletal structures are unremarkable. IMPRESSION: No active cardiopulmonary disease. Electronically Signed   By: Danae Orleans M.D.   On: 01/23/2021 19:04    Procedures Procedures   Medications Ordered in ED Medications - No data to display  ED Course  I have reviewed the triage vital signs and the nursing notes.  Pertinent labs & imaging results that were available during my care of the patient were reviewed by me and considered in my medical decision making (see chart for details).  Patient seen and examined. Work-up  reviewed.  Initial abdominal pain labs with mild hypokalemia.  Added troponin.  EKG reviewed.  Vital signs reviewed and are as follows: BP 108/73 (BP Location: Right Arm)   Pulse 85   Temp 98 F (36.7 C) (Oral)   Resp 18   SpO2 98%   11:38 PM there was a delay in obtaining a troponin, this is now returned and is normal.  Patient updated on all results.  Patient has not had any further vomiting.  He has had sips of fluid.  On reexam, continues with mild upper abdominal  pain, no rebound or guarding.  No right upper quadrant or right lower quadrant pain.  Discussed plan with patient.  Prior to discharge will give GI cocktail and Zofran.  Plan for discharged home on clear liquid diet, advancing to brat diet over the next 24 hours.  Will give Zofran, Pepcid and omeprazole.  He is encouraged follow-up with his primary care doctor.  The patient was urged to return to the Emergency Department immediately with worsening of current symptoms, worsening abdominal pain, persistent vomiting, blood noted in stools, fever, or any other concerns. The patient verbalized understanding.   BP 102/76   Pulse 71   Temp 98 F (36.7 C) (Oral)   Resp 13   SpO2 97%      MDM Rules/Calculators/A&P                          Patient with abdominal pain, upper abdomen. Vitals are stable, no fever. Labs reassuring, nml WBC, lipase, trasnaminases. Imaging CXR clear.  Do not feel CT imaging is indicated at this time without focal abdominal pain.  Considered cardiac etiology: EKG unchanged and nonischemic, troponin negative x1.  No signs of dehydration, patient is tolerating PO's. Lungs are clear and no signs suggestive of PNA. Low concern for appendicitis, cholecystitis, pancreatitis, ruptured viscus, UTI, kidney stone, aortic dissection, aortic aneurysm or other emergent abdominal etiology. Supportive therapy indicated with return if symptoms worsen.   Final Clinical Impression(s) / ED Diagnoses Final diagnoses:   Non-intractable vomiting with nausea, unspecified vomiting type  Epigastric pain    Rx / DC Orders ED Discharge Orders         Ordered    ondansetron (ZOFRAN ODT) 4 MG disintegrating tablet  Every 8 hours PRN        01/23/21 2340    famotidine (PEPCID) 20 MG tablet  2 times daily        01/23/21 2340           Renne Crigler, PA-C 01/23/21 2345    Melene Plan, DO 01/23/21 2347

## 2021-01-23 NOTE — ED Notes (Signed)
Patient provided with orange juice for PO challenge.

## 2021-01-24 MED ORDER — ONDANSETRON 4 MG PO TBDP: 4.0000 mg | ORAL_TABLET | Freq: Three times a day (TID) | ORAL | 0 refills | Status: DC | PRN

## 2021-01-24 MED ORDER — FAMOTIDINE 20 MG PO TABS: 20.0000 mg | ORAL_TABLET | Freq: Two times a day (BID) | ORAL | 0 refills | Status: DC

## 2021-09-29 IMAGING — CR DG CHEST 2V
2 series · 2 of 2 positions shown · non-contrast
Comparison: 10/09/2020

CLINICAL DATA: Chest pain a. nausea and vomiting. Abdominal
tenderness.

EXAM:
CHEST - 2 VIEW

[chest pa]
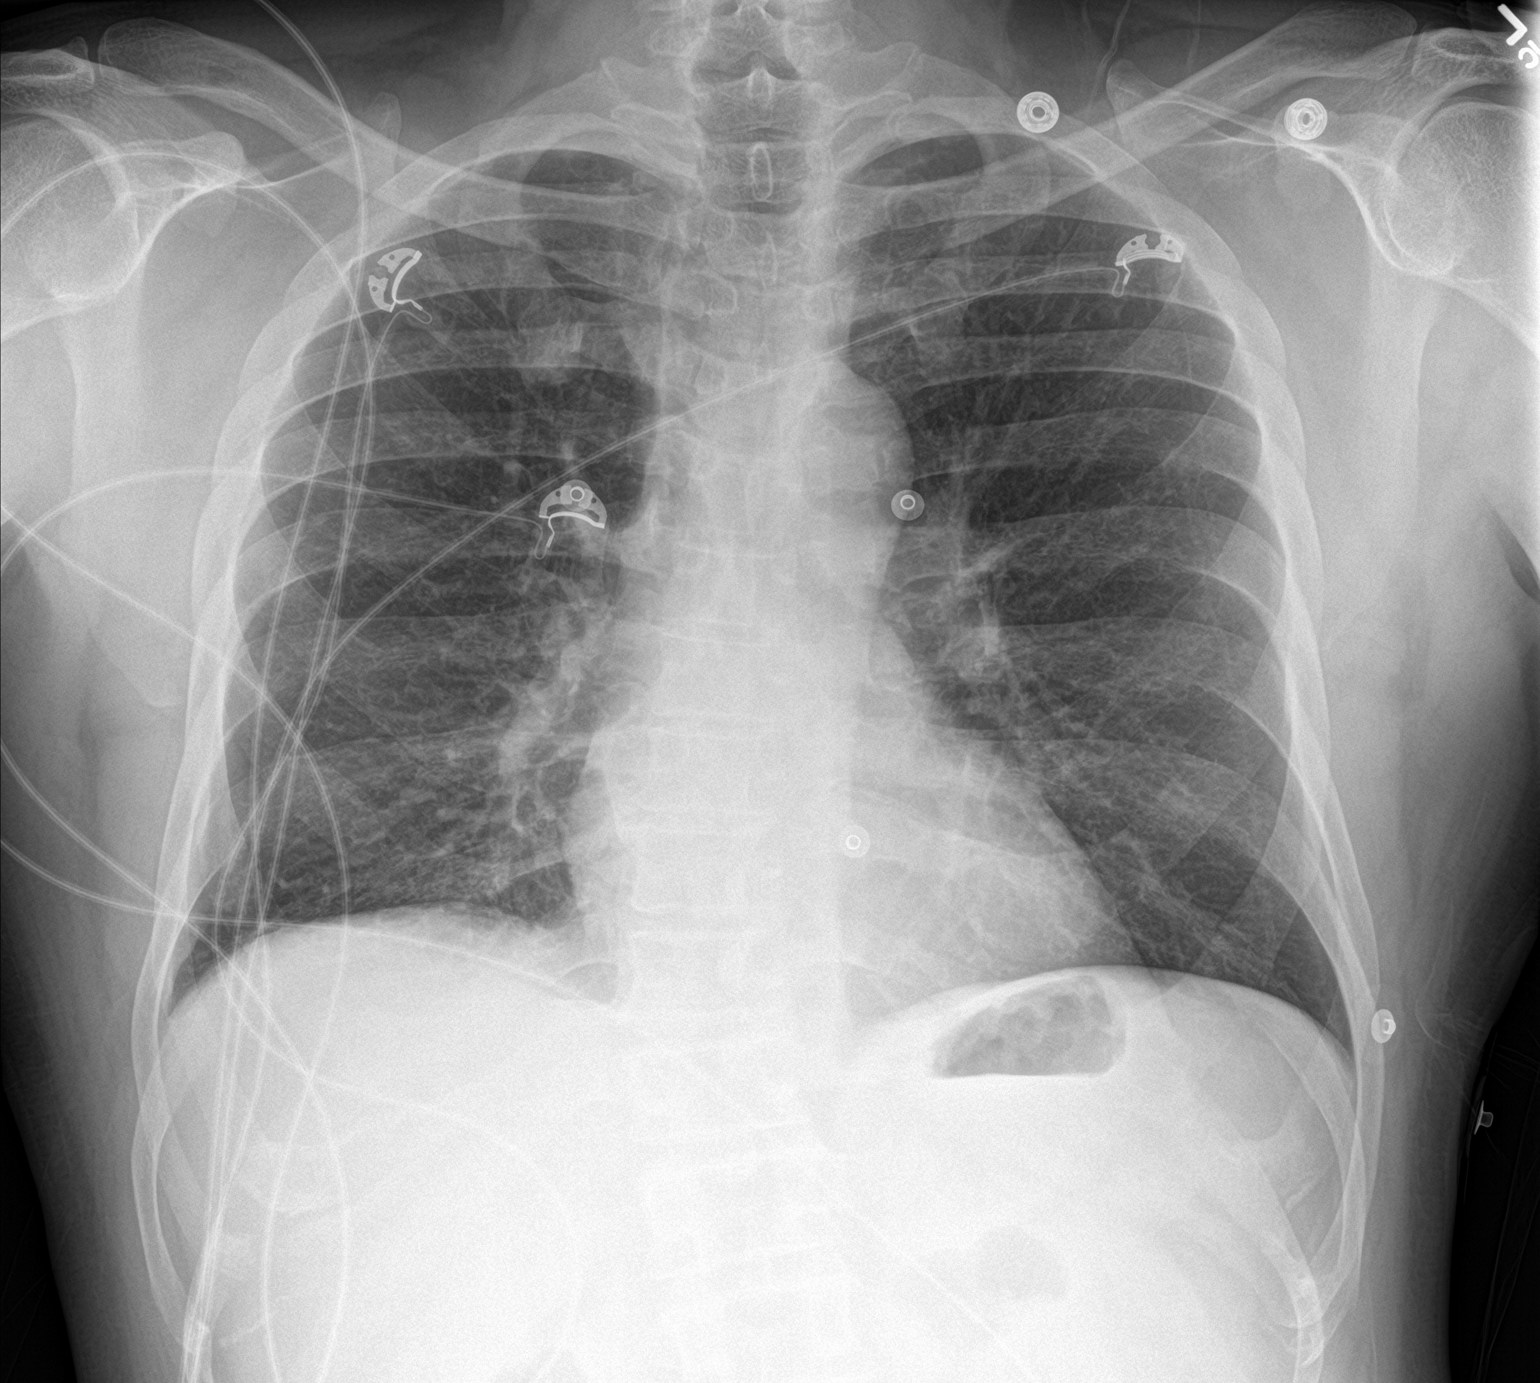

[chest lat]
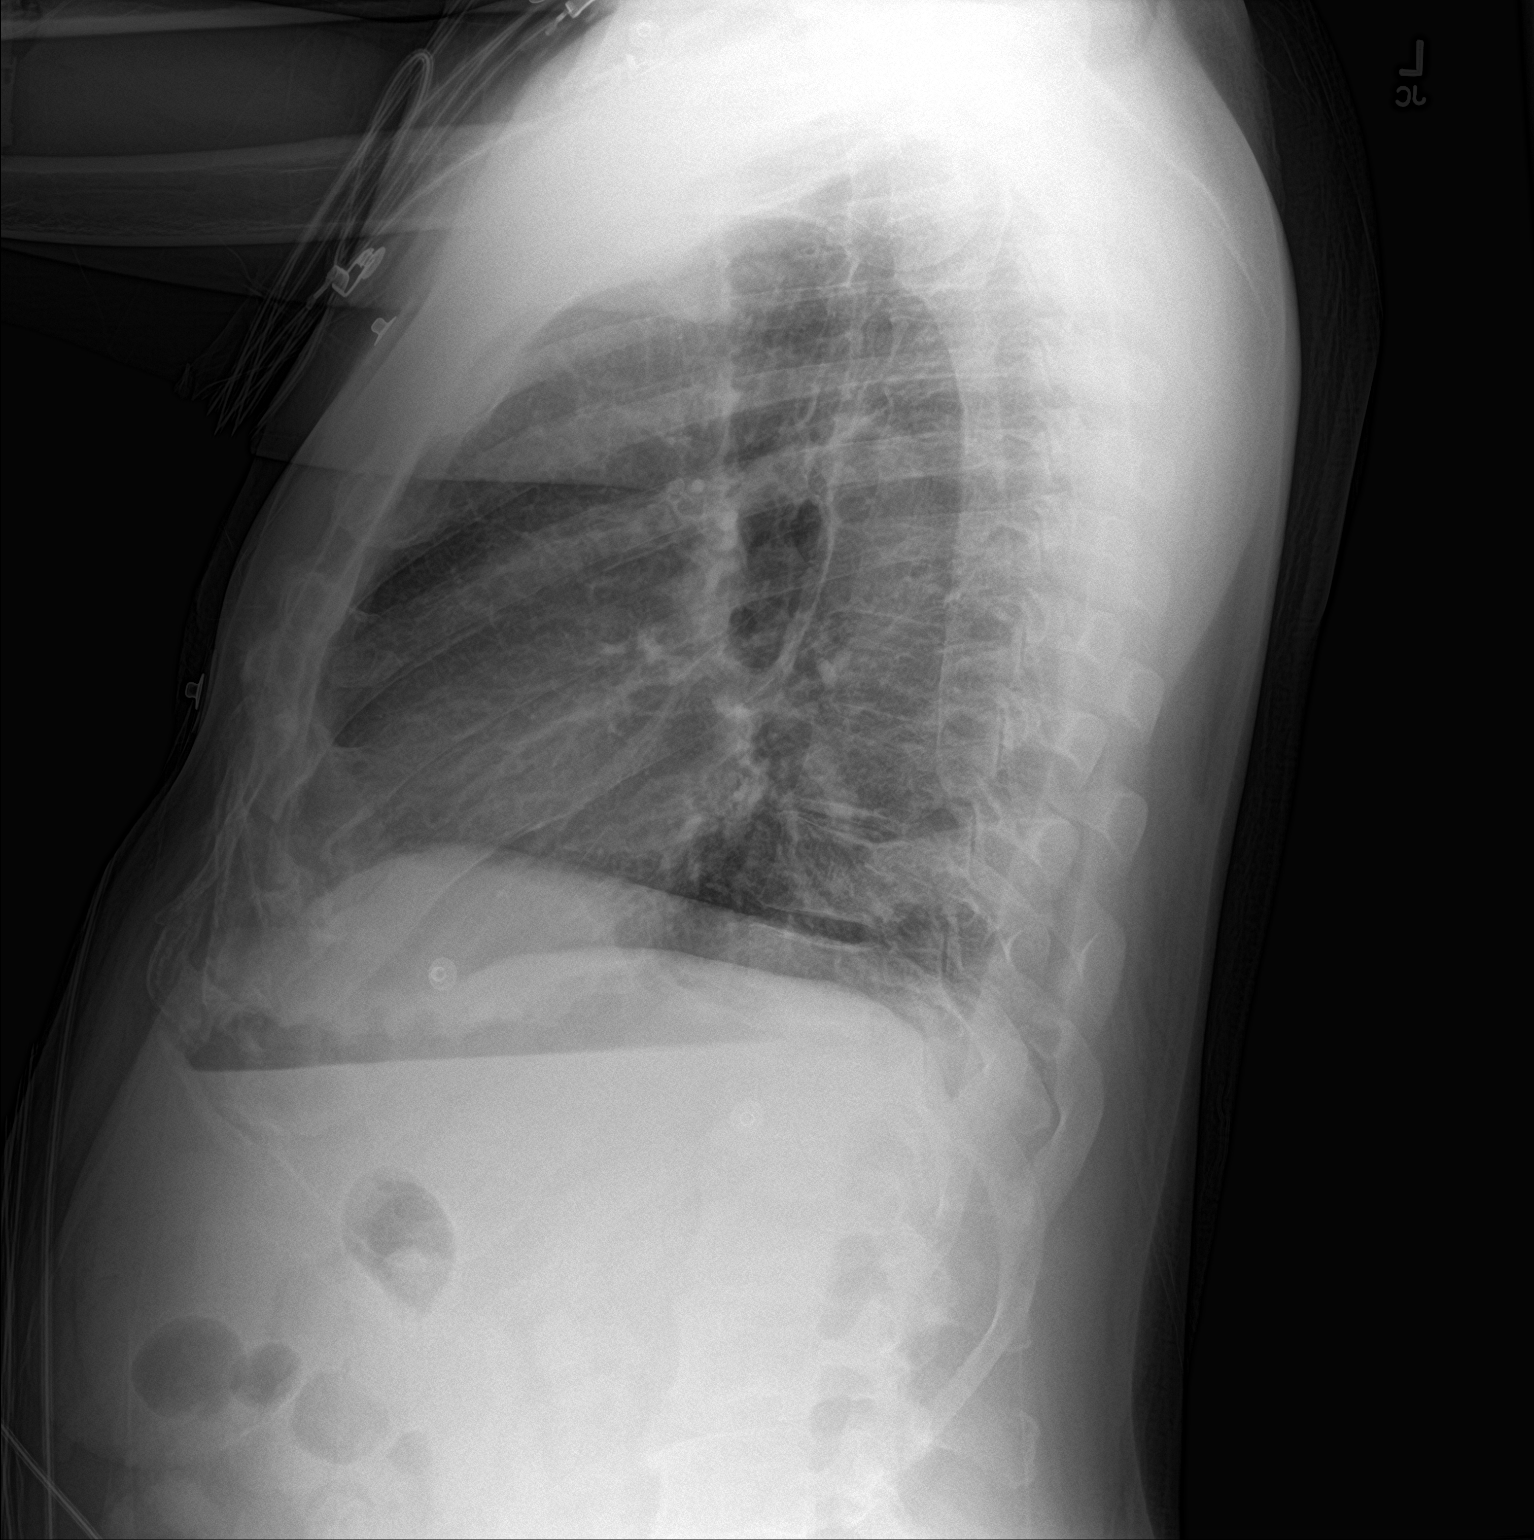

[2 of 2 positions shown; findings below may reference images not displayed]

FINDINGS: The heart size and mediastinal contours are within normal limits.
Both lungs are clear. The visualized skeletal structures are
unremarkable.
IMPRESSION: No active cardiopulmonary disease.

## 2021-10-16 ENCOUNTER — Other Ambulatory Visit: Payer: Self-pay

## 2021-10-16 ENCOUNTER — Encounter (HOSPITAL_COMMUNITY): Payer: Self-pay

## 2021-10-16 ENCOUNTER — Emergency Department (HOSPITAL_COMMUNITY)
Admission: EM | Admit: 2021-10-16 | Discharge: 2021-10-17 | Disposition: A | Discharge status: Home / Self Care | Payer: No Typology Code available for payment source | Attending: Emergency Medicine | Admitting: Emergency Medicine

## 2021-10-16 DIAGNOSIS — L03221 Cellulitis of neck: Secondary | ICD-10-CM | POA: Insufficient documentation

## 2021-10-16 DIAGNOSIS — R221 Localized swelling, mass and lump, neck: Secondary | ICD-10-CM | POA: Diagnosis present

## 2021-10-16 DIAGNOSIS — I10 Essential (primary) hypertension: Secondary | ICD-10-CM | POA: Insufficient documentation

## 2021-10-16 DIAGNOSIS — F17210 Nicotine dependence, cigarettes, uncomplicated: Secondary | ICD-10-CM | POA: Insufficient documentation

## 2021-10-16 LAB — CBC WITH DIFFERENTIAL/PLATELET
Abs Immature Granulocytes: 0.02 10*3/uL (ref 0.00–0.07)
Basophils Absolute: 0.1 10*3/uL (ref 0.0–0.1)
Basophils Relative: 1 %
Eosinophils Absolute: 0.3 10*3/uL (ref 0.0–0.5)
Eosinophils Relative: 4 %
HCT: 39.6 % (ref 39.0–52.0)
Hemoglobin: 13.2 g/dL (ref 13.0–17.0)
Immature Granulocytes: 0 %
Lymphocytes Relative: 31 %
Lymphs Abs: 2.4 10*3/uL (ref 0.7–4.0)
MCH: 31.5 pg (ref 26.0–34.0)
MCHC: 33.3 g/dL (ref 30.0–36.0)
MCV: 94.5 fL (ref 80.0–100.0)
Monocytes Absolute: 0.6 10*3/uL (ref 0.1–1.0)
Monocytes Relative: 8 %
Neutro Abs: 4.4 10*3/uL (ref 1.7–7.7)
Neutrophils Relative %: 56 %
Platelets: 149 10*3/uL — ABNORMAL LOW (ref 150–400)
RBC: 4.19 MIL/uL — ABNORMAL LOW (ref 4.22–5.81)
RDW: 14.5 % (ref 11.5–15.5)
WBC: 7.7 10*3/uL (ref 4.0–10.5)
nRBC: 0 % (ref 0.0–0.2)

## 2021-10-16 LAB — BASIC METABOLIC PANEL
Anion gap: 8 (ref 5–15)
BUN: 10 mg/dL (ref 6–20)
CO2: 20 mmol/L — ABNORMAL LOW (ref 22–32)
Calcium: 8.5 mg/dL — ABNORMAL LOW (ref 8.9–10.3)
Chloride: 107 mmol/L (ref 98–111)
Creatinine, Ser: 0.9 mg/dL (ref 0.61–1.24)
GFR, Estimated: >60 mL/min (ref >60)
Glucose, Bld: 91 mg/dL (ref 70–99)
Potassium: 3.8 mmol/L (ref 3.5–5.1)
Sodium: 135 mmol/L (ref 135–145)

## 2021-10-16 NOTE — ED Triage Notes (Addendum)
Pt reports with a cyst to the back of his head that has been there since last Thursday. Pt states that it is causing him head and neck pain.

## 2021-10-16 NOTE — ED Provider Notes (Signed)
[facility] Provider Note   CSN: [id number] Arrival date & time: 10/16/21  2043     History Chief Complaint  Patient presents with   Cyst    Don Washington is a 50 y.o. male.  The history is provided by the patient.  Don Washington is a 50 y.o. male who presents to the Emergency Department complaining of swelling to the back of his neck. He has no significant past medical history. He states that several weeks ago he started to develop boils in bilateral axillary regions. He started to notice some swelling to the back of his neck as well. He went to the [place] and he was prescribed Bactrim on Friday. He has been taking it as prescribed but states that his swelling to the back of his neck has been worsening. He has generalized malaise and has been under the weather for about one month. He attributes this to his car not currently working. No night sweats, weight loss. No new sexual partners. No chest pain, difficulty breathing, nausea, vomiting.    Past Medical History:  Diagnosis Date   Arthritis    DDD (degenerative disc disease), lumbar    Hypertension     There are no problems to display for this patient.   Past Surgical History:  Procedure Laterality Date   COLONOSCOPY     DRUG INDUCED ENDOSCOPY         Family History  Problem Relation Age of Onset   Diabetes Mother     Social History   Tobacco Use   Smoking status: Every Day    Packs/day: 0.50    Types: Cigarettes   Smokeless tobacco: Never  Vaping Use   Vaping Use: Never used  Substance Use Topics   Alcohol use: Yes   Drug use: Yes    Types: Marijuana    Home Medications Prior to Admission medications   Medication Sig Start Date End Date Taking? Authorizing Provider  cephALEXin (KEFLEX) 500 MG capsule Take 1 capsule (500 mg total) by mouth 4 (four) times daily. 10/17/21   Tilden Fossa, MD  cyclobenzaprine (FLEXERIL) 10 MG tablet Take 20 mg by mouth 3 (three) times daily.     [provider]  diclofenac Sodium (VOLTAREN) 1 % GEL Apply 2 g topically 4 (four) times daily as needed (muscle pain).    [provider]  etodolac (LODINE) 300 MG capsule Take 1 capsule (300 mg total) by mouth every 8 (eight) hours. 04/02/20   Linwood Dibbles, MD  famotidine (PEPCID) 20 MG tablet Take 1 tablet (20 mg total) by mouth 2 (two) times daily. 01/24/21   Petrucelli, Samantha R, PA-C  gabapentin (NEURONTIN) 600 MG tablet Take 1,200 mg by mouth 3 (three) times daily.    [provider]  meloxicam (MOBIC) 15 MG tablet Take 15 mg by mouth daily. 07/08/19   [provider]  omeprazole (PRILOSEC) 40 MG capsule Take 40 mg by mouth daily.    [provider]  ondansetron (ZOFRAN ODT) 4 MG disintegrating tablet Take 1 tablet (4 mg total) by mouth every 8 (eight) hours as needed for nausea or vomiting. 01/24/21   Petrucelli, Lelon Mast R, PA-C    Allergies    Tramadol  Review of Systems   Review of Systems  All other systems reviewed and are negative.  Physical Exam Updated Vital Signs BP 92/71   Pulse 79   Temp 98.1 F (36.7 C) (Oral)   Resp 18   Ht 5\' 9"  (  1.753 m)   Wt 90.7 kg   SpO2 98%   BMI 29.53 kg/m   Physical Exam Vitals and nursing note reviewed.  Constitutional:      Appearance: He is well-developed.  HENT:     Head: Normocephalic and atraumatic.  Cardiovascular:     Rate and Rhythm: Normal rate and regular rhythm.     Heart sounds: No murmur heard. Pulmonary:     Effort: Pulmonary effort is normal. No respiratory distress.     Breath sounds: Normal breath sounds.  Abdominal:     Palpations: Abdomen is soft.     Tenderness: There is no abdominal tenderness. There is no guarding or rebound.  Musculoskeletal:     Cervical back: Neck supple.     Comments: There is induration and soft tissue tenderness to the midline posterior neck was punctate central lesion. There are multiple additional small pustules to the posterior neck  as well as in the axilla bilaterally. No significant surrounding cellulitis.  Skin:    General: Skin is warm and dry.  Neurological:     Mental Status: He is alert and oriented to person, place, and time.  Psychiatric:        Behavior: Behavior normal.    ED Results / Procedures / Treatments   Labs (all labs ordered are listed, but only abnormal results are displayed) Labs Reviewed  BASIC METABOLIC PANEL - Abnormal; Notable for the following components:      Result Value   CO2 20 (*)    Calcium 8.5 (*)    All other components within normal limits  CBC WITH DIFFERENTIAL/PLATELET - Abnormal; Notable for the following components:   RBC 4.19 (*)    Platelets 149 (*)    All other components within normal limits    EKG None  Radiology No results found.  Procedures Procedures   Medications Ordered in ED Medications  cephALEXin (KEFLEX) capsule 500 mg (has no administration in time range)    ED Course  I have reviewed the triage vital signs and the nursing notes.  Pertinent labs & imaging results that were available during my care of the patient were reviewed by me and considered in my medical decision making (see chart for details).    MDM Rules/Calculators/A&P                           patient here for evaluation of swelling and pain to the left posterior neck. He is able to range the neck without difficulty. He has local induration with tenderness to palpation. No visible abscess on examination or on bedside ultrasound. He is non-toxic appearing on evaluation. No evidence of deep tissue space infection. Discussed with patient home care for cellulitis. Will broaden his antibiotics to include Keflex. Discussed outpatient follow-up and return precautions. Final Clinical Impression(s) / ED Diagnoses Final diagnoses:  Cellulitis of neck    Rx / DC Orders ED Discharge Orders          Ordered    cephALEXin (KEFLEX) 500 MG capsule  4 times daily,   Status:  Discontinued         10/17/21 0013    cephALEXin (KEFLEX) 500 MG capsule  4 times daily        10/17/21 0013             Tilden Fossa, MD 10/17/21 [phone]

## 2021-10-17 MED ORDER — CEPHALEXIN 500 MG PO CAPS: 500.0000 mg | ORAL_CAPSULE | Freq: Four times a day (QID) | ORAL | 0 refills | Status: DC

## 2021-10-17 MED ORDER — CEPHALEXIN 500 MG PO CAPS
500.0000 mg | ORAL_CAPSULE | Freq: Once | ORAL | Status: AC
  Administered 2021-10-17: 500 mg via ORAL
  Filled 2021-10-17: qty 1

## 2021-10-17 NOTE — Discharge Instructions (Signed)
Continue your current antibiotic and take the second one that was prescribed today.

## 2022-05-15 ENCOUNTER — Other Ambulatory Visit: Payer: Self-pay

## 2022-05-15 ENCOUNTER — Emergency Department (HOSPITAL_COMMUNITY)
Admission: EM | Admit: 2022-05-15 | Discharge: 2022-05-16 | Discharge status: Against Medical Advice | Payer: No Typology Code available for payment source | Attending: Emergency Medicine | Admitting: Emergency Medicine

## 2022-05-15 ENCOUNTER — Encounter (HOSPITAL_COMMUNITY): Payer: Self-pay

## 2022-05-15 DIAGNOSIS — R630 Anorexia: Secondary | ICD-10-CM | POA: Diagnosis not present

## 2022-05-15 DIAGNOSIS — R109 Unspecified abdominal pain: Secondary | ICD-10-CM | POA: Diagnosis present

## 2022-05-15 DIAGNOSIS — R1110 Vomiting, unspecified: Secondary | ICD-10-CM | POA: Insufficient documentation

## 2022-05-15 DIAGNOSIS — R197 Diarrhea, unspecified: Secondary | ICD-10-CM | POA: Insufficient documentation

## 2022-05-15 DIAGNOSIS — Z5321 Procedure and treatment not carried out due to patient leaving prior to being seen by health care provider: Secondary | ICD-10-CM | POA: Diagnosis not present

## 2022-05-15 DIAGNOSIS — K5900 Constipation, unspecified: Secondary | ICD-10-CM | POA: Diagnosis not present

## 2022-05-15 LAB — CBC WITH DIFFERENTIAL/PLATELET
Abs Immature Granulocytes: 0.01 10*3/uL (ref 0.00–0.07)
Basophils Absolute: 0 10*3/uL (ref 0.0–0.1)
Basophils Relative: 1 %
Eosinophils Absolute: 0.1 10*3/uL (ref 0.0–0.5)
Eosinophils Relative: 2 %
HCT: 47.3 % (ref 39.0–52.0)
Hemoglobin: 15.6 g/dL (ref 13.0–17.0)
Immature Granulocytes: 0 %
Lymphocytes Relative: 46 %
Lymphs Abs: 2.8 10*3/uL (ref 0.7–4.0)
MCH: 31.6 pg (ref 26.0–34.0)
MCHC: 33 g/dL (ref 30.0–36.0)
MCV: 95.7 fL (ref 80.0–100.0)
Monocytes Absolute: 0.4 10*3/uL (ref 0.1–1.0)
Monocytes Relative: 7 %
Neutro Abs: 2.7 10*3/uL (ref 1.7–7.7)
Neutrophils Relative %: 44 %
Platelets: 150 10*3/uL (ref 150–400)
RBC: 4.94 MIL/uL (ref 4.22–5.81)
RDW: 13.9 % (ref 11.5–15.5)
WBC: 6.2 10*3/uL (ref 4.0–10.5)
nRBC: 0 % (ref 0.0–0.2)

## 2022-05-15 LAB — COMPREHENSIVE METABOLIC PANEL
ALT: 20 U/L (ref 0–44)
AST: 18 U/L (ref 15–41)
Albumin: 4.2 g/dL (ref 3.5–5.0)
Alkaline Phosphatase: 88 U/L (ref 38–126)
Anion gap: 9 (ref 5–15)
BUN: 9 mg/dL (ref 6–20)
CO2: 22 mmol/L (ref 22–32)
Calcium: 8.7 mg/dL — ABNORMAL LOW (ref 8.9–10.3)
Chloride: 109 mmol/L (ref 98–111)
Creatinine, Ser: 0.72 mg/dL (ref 0.61–1.24)
GFR, Estimated: >60 mL/min (ref >60)
Glucose, Bld: 87 mg/dL (ref 70–99)
Potassium: 3.9 mmol/L (ref 3.5–5.1)
Sodium: 140 mmol/L (ref 135–145)
Total Bilirubin: 0.5 mg/dL (ref 0.3–1.2)
Total Protein: 7.6 g/dL (ref 6.5–8.1)

## 2022-05-15 LAB — LIPASE, BLOOD: Lipase: 36 U/L (ref 11–51)

## 2022-05-15 NOTE — ED Provider Triage Note (Signed)
Emergency Medicine Provider Triage Evaluation Note  Janari Yamada , a 51 y.o. male  was evaluated in triage.  Pt complains of abdominal pain, diarrhea, vomiting.  Symptoms have been going on for the past 2 to 3 months.  He has a history of colon polyps that were removed 1.5 years ago and is concerned that they have recurred and causing his symptoms.  He tried to see his PCP but was unable to do so.  Denies any bloody stools..  Review of Systems  Positive: Abdominal pain, diarrhea, vomiting Negative: Bloody stools  Physical Exam  BP 124/84   Pulse 69   Temp 98.7 F (37.1 C) (Oral)   Resp 18   SpO2 100%  Gen:   Awake, no distress   Resp:  Normal effort  MSK:   Moves extremities without difficulty  Other:  Abdomen tender in the lower region without rebound or guarding  Medical Decision Making  Medically screening exam initiated at 3:05 PM.  Appropriate orders placed.  Adynn Caseres was informed that the remainder of the evaluation will be completed by another provider, this initial triage assessment does not replace that evaluation, and the importance of remaining in the ED until their evaluation is complete.  Labs ordered   Dietrich Pates, [place] 05/15/22 1506

## 2022-05-15 NOTE — ED Triage Notes (Signed)
Complaining of abd pain n/v/d on and off diarrhea/constipation. Last year had polyps removed. Reports loss of appetite.

## 2022-05-15 NOTE — ED Notes (Addendum)
Pt continue to be rude in lobby asking for a note

## 2022-05-15 NOTE — ED Notes (Signed)
Pt has came up to me asking for work note. I have told pt he can not get work note until he seen by a provider in the back. Pt keep talking over me and being rude. I have tired to explain to him the process of being in the waiting room.

## 2022-05-16 NOTE — ED Notes (Signed)
Pt seen leaving out the lobby haven't return

## 2022-12-23 ENCOUNTER — Ambulatory Visit (HOSPITAL_COMMUNITY)
Admission: EM | Admit: 2022-12-23 | Discharge: 2022-12-23 | Disposition: A | Discharge status: Home / Self Care | Payer: No Typology Code available for payment source | Attending: Physician Assistant | Admitting: Physician Assistant

## 2022-12-23 ENCOUNTER — Other Ambulatory Visit: Payer: Self-pay

## 2022-12-23 ENCOUNTER — Encounter (HOSPITAL_COMMUNITY): Payer: Self-pay | Admitting: *Deleted

## 2022-12-23 DIAGNOSIS — W57XXXA Bitten or stung by nonvenomous insect and other nonvenomous arthropods, initial encounter: Secondary | ICD-10-CM

## 2022-12-23 DIAGNOSIS — L03115 Cellulitis of right lower limb: Secondary | ICD-10-CM

## 2022-12-23 MED ORDER — MUPIROCIN 2 % EX OINT
1.0000 | TOPICAL_OINTMENT | Freq: Two times a day (BID) | CUTANEOUS | 0 refills | Status: AC
Start: 2022-12-23 — End: ?

## 2022-12-23 MED ORDER — CEPHALEXIN 500 MG PO CAPS: 500.0000 mg | ORAL_CAPSULE | Freq: Four times a day (QID) | ORAL | 0 refills | Status: DC

## 2022-12-23 NOTE — Discharge Instructions (Addendum)
Start cephalexin 4 times daily for 1 week.  Keep wound clean and apply Bactroban ointment with dressing changes.  Demarcate area of redness with a marker and if this continues to spread after he been on the antibiotics for 1 to 2 days you need to be reevaluated.  If anything worsens and you develop rapid spread of redness, red streaks, fever, nausea/vomiting you need to be seen immediately.

## 2022-12-23 NOTE — ED Provider Notes (Signed)
[name]    CSN: [id number] Arrival date & time: 12/23/22  1008      History   Chief Complaint Chief Complaint  Patient presents with   Insect Bite    HPI Don Washington is a 52 y.o. male.   Patient presents today with redness and swelling on his right lateral lower leg.  Reports that he has been bitten by something several times in his home but these have generally healed without difficulty.  This bite occurred approximately 4 to 5 days ago and he developed redness and pain starting a few days after initial insult.  He has not witnessed a specific insect.  Reports that pain is currently rated 8/9 on a 0-10 pain scale, described as aching, worse with movement or palpation, no alleviating factors identified.  Denies any fever, nausea, vomiting.  He has been cleaning this with hydrogen peroxide.  He is MRSA additional risk factors including recent hospitalization or surgery procedure.  Denies recurrent skin infections.  Denies history of diabetes or immunosuppression.  Denies any recent antibiotics.    Past Medical History:  Diagnosis Date   Arthritis    DDD (degenerative disc disease), lumbar    Hypertension     There are no problems to display for this patient.   Past Surgical History:  Procedure Laterality Date   COLONOSCOPY     DRUG INDUCED ENDOSCOPY         Home Medications    Prior to Admission medications   Medication Sig Start Date End Date Taking? Authorizing Provider  mupirocin ointment (BACTROBAN) 2 % Apply 1 Application topically 2 (two) times daily. 12/23/22  Yes Don Rooke K, PA-C  cephALEXin (KEFLEX) 500 MG capsule Take 1 capsule (500 mg total) by mouth 4 (four) times daily. 12/23/22   Don Washington, Don Skill, PA-C  cyclobenzaprine (FLEXERIL) 10 MG tablet Take 20 mg by mouth 3 (three) times daily.    [provider]  diclofenac Sodium (VOLTAREN) 1 % GEL Apply 2 g topically 4 (four) times daily as needed (muscle pain).    [provider]  gabapentin (NEURONTIN) 600 MG tablet Take 1,200 mg by mouth 3 (three) times daily.    [provider]  meloxicam (MOBIC) 15 MG tablet Take 15 mg by mouth daily. 07/08/19   [provider]  omeprazole (PRILOSEC) 40 MG capsule Take 40 mg by mouth daily.    [provider]    Family History Family History  Problem Relation Age of Onset   Diabetes Mother     Social History Social History   Tobacco Use   Smoking status: Every Day    Packs/day: 0.50    Types: Cigarettes   Smokeless tobacco: Never  Vaping Use   Vaping Use: Never used  Substance Use Topics   Alcohol use: Yes   Drug use: Yes    Types: Marijuana     Allergies   Tramadol   Review of Systems Review of Systems  Constitutional:  Positive for activity change. Negative for appetite change, fatigue and fever.  Gastrointestinal:  Negative for abdominal pain, diarrhea, nausea and vomiting.  Musculoskeletal:  Positive for myalgias. Negative for arthralgias.  Skin:  Positive for color change and wound.     Physical Exam Triage Vital Signs ED Triage Vitals  Enc Vitals Group     BP 12/23/22 1109 119/79     Pulse --      Resp 12/23/22 1109 18     Temp 12/23/22 1109  98.1 F (36.7 C)     Temp src --      SpO2 12/23/22 1109 98 %     Weight --      Height --      Head Circumference --      Peak Flow --      Pain Score 12/23/22 1103 9     Pain Loc --      Pain Edu? --      Excl. in GC? --    No data found.  Updated Vital Signs BP 119/79   Pulse 91   Temp 98.1 F (36.7 C)   Resp 18   SpO2 98%   Visual Acuity Right Eye Distance:   Left Eye Distance:   Bilateral Distance:    Right Eye Near:   Left Eye Near:    Bilateral Near:     Physical Exam Vitals reviewed.  Constitutional:      General: He is awake.     Appearance: Normal appearance. He is well-developed. He is not ill-appearing.     Comments: Very pleasant male appears stated age in no acute distress sitting  comfortably in exam room  HENT:     Head: Normocephalic and atraumatic.     Mouth/Throat:     Pharynx: Uvula midline. No oropharyngeal exudate or posterior oropharyngeal erythema.  Cardiovascular:     Rate and Rhythm: Normal rate and regular rhythm.     Heart sounds: Normal heart sounds, S1 normal and S2 normal. No murmur heard. Pulmonary:     Effort: Pulmonary effort is normal.     Breath sounds: Normal breath sounds. No stridor. No wheezing, rhonchi or rales.     Comments: Clear to auscultation bilaterally Skin:    Findings: Erythema and wound present.          Comments: Half a centimeter ulcerated lesion with surrounding erythema measuring approximately 4 cm x 3 cm.  Wound is indurated without significant fluctuance.  No streaking or evidence of lymphangitis.  No bleeding or drainage noted.  Neurological:     Mental Status: He is alert.  Psychiatric:        Behavior: Behavior is cooperative.      UC Treatments / Results  Labs (all labs ordered are listed, but only abnormal results are displayed) Labs Reviewed - No data to display  EKG   Radiology No results found.  Procedures Procedures (including critical care time)  Medications Ordered in UC Medications - No data to display  Initial Impression / Assessment and Plan / UC Course  I have reviewed the triage vital signs and the nursing notes.  Pertinent labs & imaging results that were available during my care of the patient were reviewed by me and considered in my medical decision making (see chart for details).     Patient is well-appearing, afebrile, nontoxic, nontachycardic.  Concern for cellulitis given increasing redness, swelling, discomfort.  No significant fluctuance that would be amenable to I&D in clinic today.  Patient has no significant risk factors for MRSA so was started on cephalexin 500 mg 4 times daily.  Recommended that he demarcate area of erythema and monitor this; if this continues spread after  initiation of antibiotics he is to return for reevaluation.  Recommended he keep the area clean and apply Bactroban ointment with dressing changes twice daily.  Discussed that if he has any worsening or changing symptoms including spread of erythema, streaking, fever, nausea/vomiting interfere with oral intake he needs to be  evaluated immediately.  Strict return precautions given.  Work excuse note provided.  Final Clinical Impressions(s) / UC Diagnoses   Final diagnoses:  Cellulitis of leg, right  Bug bite with infection, initial encounter     Discharge Instructions      Start cephalexin 4 times daily for 1 week.  Keep wound clean and apply Bactroban ointment with dressing changes.  Demarcate area of redness with a marker and if this continues to spread after he been on the antibiotics for 1 to 2 days you need to be reevaluated.  If anything worsens and you develop rapid spread of redness, red streaks, fever, nausea/vomiting you need to be seen immediately.     ED Prescriptions     Medication Sig Dispense Auth. Provider   cephALEXin (KEFLEX) 500 MG capsule Take 1 capsule (500 mg total) by mouth 4 (four) times daily. 28 capsule Bruchy Mikel K, PA-C   mupirocin ointment (BACTROBAN) 2 % Apply 1 Application topically 2 (two) times daily. 22 g Gevorg Brum K, PA-C      PDMP not reviewed this encounter.   Terrilee Croak, PA-C 12/23/22 1140

## 2022-12-23 NOTE — ED Triage Notes (Signed)
Pt reports a spider bite on RT lower leg. Pt has pain at site with redness.

## 2023-04-03 ENCOUNTER — Ambulatory Visit (HOSPITAL_COMMUNITY): Payer: No Typology Code available for payment source

## 2023-04-03 ENCOUNTER — Encounter (HOSPITAL_COMMUNITY): Payer: Self-pay | Admitting: *Deleted

## 2023-04-03 ENCOUNTER — Other Ambulatory Visit: Payer: Self-pay

## 2023-04-03 ENCOUNTER — Ambulatory Visit (HOSPITAL_COMMUNITY)
Admission: EM | Admit: 2023-04-03 | Discharge: 2023-04-03 | Disposition: A | Discharge status: Home / Self Care | Payer: No Typology Code available for payment source

## 2023-04-03 DIAGNOSIS — K5900 Constipation, unspecified: Secondary | ICD-10-CM

## 2023-04-03 DIAGNOSIS — R0789 Other chest pain: Secondary | ICD-10-CM

## 2023-04-03 HISTORY — DX: Polyp of colon: K63.5

## 2023-04-03 HISTORY — DX: Pure hypercholesterolemia, unspecified: E78.00

## 2023-04-03 HISTORY — DX: Prediabetes: R73.03

## 2023-04-03 HISTORY — DX: Gastro-esophageal reflux disease without esophagitis: K21.9

## 2023-04-03 MED ORDER — POLYETHYLENE GLYCOL 3350 17 G PO PACK: 17.0000 g | PACK | Freq: Every day | ORAL | 0 refills | Status: AC | PRN

## 2023-04-03 NOTE — ED Provider Notes (Signed)
[facility]    CSN: [id number] Arrival date & time: 04/03/23  4098      History   Chief Complaint Chief Complaint  Patient presents with   Abdominal Pain   Constipation   Chest Pain    HPI Don Washington is a 52 y.o. male.   HPI Patient with a known history of GERD, hypertension, prediabetes presents today with a 3-week history of intermittent generalized abdominal pain, worsening constipation, and intermittent left-sided chest pain.  Patient reports he has been taking daily stool softeners along with his prescribed omeprazole without any relief.  He is concerned as he is bloating, belching, and taking stool softeners without any significant bowel movement.  He reports occasional passage of gas.  He reports that he has recently changed his diet and is presently eating mostly 1 meal a day high in protein and low in vegetable and carbohydrates.  Patient has a history of polyp removal 2 to 3 years ago and reports that he was told that the polyps were precancerous.  He is followed by the [facility] and reports he has a primary care appointment later this month and he is unaware of when he is supposed to have a updated colonoscopy.  At present he denies any chest pain or severe abdominal pain.  He has been negative for fever.  Endorses some fatigue and nausea. Past Medical History:  Diagnosis Date   Arthritis    Colon polyps    DDD (degenerative disc disease), lumbar    GERD (gastroesophageal reflux disease)    Hypercholesteremia    Hypertension    Pre-diabetes     There are no problems to display for this patient.   Past Surgical History:  Procedure Laterality Date   COLONOSCOPY     DRUG INDUCED ENDOSCOPY     TOTAL HIP ARTHROPLASTY         Home Medications    Prior to Admission medications   Medication Sig Start Date End Date Taking? Authorizing Provider  ATORVASTATIN CALCIUM PO Take by mouth.   Yes [provider]  BUPROPION HCL PO Take  by mouth.   Yes [provider]  BUSPIRONE HCL PO Take by mouth.   Yes [provider]  CALCIUM PO Take by mouth.   Yes [provider]  Cholecalciferol (VITAMIN D-3 PO) Take by mouth.   Yes [provider]  cyclobenzaprine (FLEXERIL) 10 MG tablet Take 20 mg by mouth 3 (three) times daily.   Yes [provider]  diclofenac Sodium (VOLTAREN) 1 % GEL Apply 2 g topically 4 (four) times daily as needed (muscle pain).   Yes [provider]  DOCUSATE SODIUM PO Take by mouth.   Yes [provider]  gabapentin (NEURONTIN) 600 MG tablet Take 1,200 mg by mouth 3 (three) times daily.   Yes [provider]  meloxicam (MOBIC) 15 MG tablet Take 15 mg by mouth daily. 07/08/19  Yes [provider]  Multiple Vitamin (MULTIVITAMIN PO) Take by mouth.   Yes [provider]  polyethylene glycol (MIRALAX MIX-IN PAX) 17 g packet Take 17 g by mouth daily as needed. 04/03/23  Yes Bing Neighbors, NP  cephALEXin (KEFLEX) 500 MG capsule Take 1 capsule (500 mg total) by mouth 4 (four) times daily. 12/23/22   Raspet, Noberto Retort, PA-C  mupirocin ointment (BACTROBAN) 2 % Apply 1 Application topically 2 (two) times daily. 12/23/22   Raspet, Noberto Retort, PA-C  omeprazole (PRILOSEC) 40 MG capsule Take 40  mg by mouth daily.    [provider]    Family History Family History  Problem Relation Age of Onset   Diabetes Mother     Social History Social History   Tobacco Use   Smoking status: Every Day    Packs/day: .5    Types: Cigarettes   Smokeless tobacco: Never  Vaping Use   Vaping Use: Never used  Substance Use Topics   Alcohol use: Yes    Comment: 16 oz daily   Drug use: Yes    Types: Marijuana     Allergies   Tramadol   Review of Systems Review of Systems Pertinent negatives listed in HPI   Physical Exam Triage Vital Signs ED Triage Vitals  Enc Vitals Group     BP 04/03/23 1025 130/83     Pulse Rate 04/03/23  1025 77     Resp 04/03/23 1025 16     Temp 04/03/23 1025 97.8 F (36.6 C)     Temp Source 04/03/23 1025 Oral     SpO2 04/03/23 1025 97 %     Weight --      Height --      Head Circumference --      Peak Flow --      Pain Score 04/03/23 1026 6     Pain Loc --      Pain Edu? --      Excl. in GC? --      Updated Vital Signs BP 130/83   Pulse 77   Temp 97.8 F (36.6 C) (Oral)   Resp 16   SpO2 97%   Visual Acuity Right Eye Distance:   Left Eye Distance:   Bilateral Distance:    Right Eye Near:   Left Eye Near:    Bilateral Near:     Physical Exam Constitutional:      General: He is not in acute distress.    Appearance: He is well-developed. He is not ill-appearing or toxic-appearing.  HENT:     Head: Normocephalic.  Cardiovascular:     Rate and Rhythm: Normal rate and regular rhythm.  Pulmonary:     Effort: Pulmonary effort is normal.     Breath sounds: Normal breath sounds.  Abdominal:     General: Abdomen is protuberant. Bowel sounds are increased. There is distension.     Tenderness: There is generalized abdominal tenderness. There is no right CVA tenderness, left CVA tenderness, guarding or rebound.  Skin:    General: Skin is warm.     Capillary Refill: Capillary refill takes less than 2 seconds.  Neurological:     General: No focal deficit present.     Mental Status: He is alert.      UC Treatments / Results  Labs (all labs ordered are listed, but only abnormal results are displayed) Labs Reviewed - No data to display  EKG NSR, no ST changes, 70 bpm  Radiology DG Abd 1 View  Result Date: 04/03/2023 CLINICAL DATA:  Constipation bloating. EXAM: ABDOMEN - 1 VIEW COMPARISON:  None Available. FINDINGS: The bowel gas pattern is normal. No substantial stool volume evident. No radio-opaque calculi or other significant radiographic abnormality are seen. Status post right total hip replacement. IMPRESSION: No acute findings. Electronically Signed   By: Kennith Center M.D.   On: 04/03/2023 11:20    Procedures Procedures (including critical care time)  Medications Ordered in UC Medications - No data to display  Initial Impression / Assessment and Plan /  UC Course  I have reviewed the triage vital signs and the nursing notes.  Pertinent labs & imaging results that were available during my care of the patient were reviewed by me and considered in my medical decision making (see chart for details).   Imaging of the abdomen is negative for any bowel obstruction or any increased stool burden.  EKG unremarkable no ST changes or acute abnormalities noted on tracing.  Low suspicion for acute abdomen.  Recommend MiraLAX, increasing fiber and water intake.  Continue stool softeners and omeprazole.  Patient has upcoming follow-up with primary care provider advised to discuss repeat colonoscopy as patient reports 2 to 3 years ago he had an abnormal colonoscopy which 10 polyps were removed.  ER precautions given if any of his symptoms worsen or do not improve with recommendations. Final Clinical Impressions(s) / UC Diagnoses   Final diagnoses:  Constipation, unspecified constipation type  Atypical chest pain     Discharge Instructions      Your x-ray of abdomen is negative for bowel blockage or significant constipation.  Recommend increasing fiber into your diet I have included information on foods that are high in fiber.  I also recommend a trial of MiraLAX over the next 3 to 4 days to help reduce your stool burden.  May continue taking the stool softeners with the MiraLAX. Recommend drinking 6 to 8 glasses of water or 316.9 ounce bottles of water this will help also reduce your bloating and help with passions of stool.  If you develop any severe abdominal pain or chest pain that feels like a stabbing persistent pain go the nearest emergency department for evaluation.  If you notice any bleeding during bowel movements this would also be indication to go to  the emergency room.  Your EKG was also unremarkable did not indicate any specific reason for chest pain.  If you continue to have chest pain go to the nearest emergency department.  Also discussed with your primary care provider at your next appointment of follow-up colonoscopy.     ED Prescriptions     Medication Sig Dispense Auth. Provider   polyethylene glycol (MIRALAX MIX-IN PAX) 17 g packet Take 17 g by mouth daily as needed. 14 each Bing Neighbors, NP      PDMP not reviewed this encounter.   Bing Neighbors, NP 04/03/23 1153

## 2023-04-03 NOTE — Discharge Instructions (Addendum)
Your x-ray of abdomen is negative for bowel blockage or significant constipation.  Recommend increasing fiber into your diet I have included information on foods that are high in fiber.  I also recommend a trial of MiraLAX over the next 3 to 4 days to help reduce your stool burden.  May continue taking the stool softeners with the MiraLAX. Recommend drinking 6 to 8 glasses of water or 316.9 ounce bottles of water this will help also reduce your bloating and help with passions of stool.  If you develop any severe abdominal pain or chest pain that feels like a stabbing persistent pain go the nearest emergency department for evaluation.  If you notice any bleeding during bowel movements this would also be indication to go to the emergency room.  Your EKG was also unremarkable did not indicate any specific reason for chest pain.  If you continue to have chest pain go to the nearest emergency department.  Also discussed with your primary care provider at your next appointment of follow-up colonoscopy.

## 2023-04-03 NOTE — ED Notes (Signed)
EKG machine unavailable at this time.

## 2023-04-03 NOTE — ED Triage Notes (Signed)
C/O constant mid-abd pain, occasionally in RLQ, onset 3 wks ago along with fatigue and constipation. Reports last BM this AM with small results. Takes stool softener daily. C/O nausea, denies vomiting, though he had episode of vomiting 1 wk ago.  C/O intermittent "squeezing" left chest pain over past few weeks; denies any chest pain at present. Also c/o transient dizziness and lightheadedness, but states not associated with chest pain.

## 2024-01-21 ENCOUNTER — Encounter (HOSPITAL_COMMUNITY): Payer: Self-pay

## 2024-01-21 ENCOUNTER — Ambulatory Visit (INDEPENDENT_AMBULATORY_CARE_PROVIDER_SITE_OTHER): Payer: No Typology Code available for payment source

## 2024-01-21 ENCOUNTER — Ambulatory Visit (HOSPITAL_COMMUNITY)
Admission: EM | Admit: 2024-01-21 | Discharge: 2024-01-21 | Disposition: A | Discharge status: Home / Self Care | Payer: No Typology Code available for payment source

## 2024-01-21 DIAGNOSIS — R051 Acute cough: Secondary | ICD-10-CM

## 2024-01-21 DIAGNOSIS — J189 Pneumonia, unspecified organism: Secondary | ICD-10-CM | POA: Diagnosis not present

## 2024-01-21 MED ORDER — AZITHROMYCIN 250 MG PO TABS: ORAL_TABLET | ORAL | 0 refills | Status: AC

## 2024-01-21 MED ORDER — PREDNISONE 20 MG PO TABS: 40.0000 mg | ORAL_TABLET | Freq: Every day | ORAL | 0 refills | Status: AC

## 2024-01-21 MED ORDER — AMOXICILLIN-POT CLAVULANATE 875-125 MG PO TABS: 1.0000 | ORAL_TABLET | Freq: Two times a day (BID) | ORAL | 0 refills | Status: AC

## 2024-01-21 MED ORDER — BENZONATATE 100 MG PO CAPS: 100.0000 mg | ORAL_CAPSULE | Freq: Three times a day (TID) | ORAL | 0 refills | Status: AC

## 2024-01-21 NOTE — ED Provider Notes (Signed)
 [facility]    CSN: [id number] Arrival date & time: 01/21/24  1347      History   Chief Complaint Chief Complaint  Patient presents with   Cough    HPI Don Washington is a 53 y.o. male.   Patient presents with cough, shortness of breath, intermittent wheezing, congestion, body aches, and chills x 2 weeks. Patient states that he started to a feel better a few days ago, but yesterday the symptoms came back and a worse than before.   Patient reports taking OTC cold medication, nasal spray, Tylenol, and Robitussin with minimal relief. Denies chest pain, known fever, abdominal pain, nausea, vomiting, and diarrhea. Denies history of asthma and COPD.    Cough   Past Medical History:  Diagnosis Date   Arthritis    Colon polyps    DDD (degenerative disc disease), lumbar    GERD (gastroesophageal reflux disease)    Hypercholesteremia    Hypertension    Pre-diabetes     There are no active problems to display for this patient.   Past Surgical History:  Procedure Laterality Date   COLONOSCOPY     DRUG INDUCED ENDOSCOPY     TOTAL HIP ARTHROPLASTY         Home Medications    Prior to Admission medications   Medication Sig Start Date End Date Taking? Authorizing Provider  amoxicillin-clavulanate (AUGMENTIN) 875-125 MG tablet Take 1 tablet by mouth every 12 (twelve) hours. 01/21/24  Yes Wynonia Lawman A, NP  aspirin EC 81 MG tablet Take 81 mg by mouth daily. 09/10/23  Yes [provider]  azithromycin (ZITHROMAX Z-PAK) 250 MG tablet Take 2 pills (500mg ) first day and one pill (250mg ) the remaining 4 days. 01/21/24  Yes Susann Givens, Deddrick Saindon A, NP  benzonatate (TESSALON) 100 MG capsule Take 1 capsule (100 mg total) by mouth every 8 (eight) hours. 01/21/24  Yes Susann Givens, Skarlett Sedlacek A, NP  predniSONE (DELTASONE) 20 MG tablet Take 2 tablets (40 mg total) by mouth daily for 5 days. 01/21/24 01/26/24 Yes Wynonia Lawman A, NP  Vitamin D, Cholecalciferol, 25 MCG (1000 UT)  CAPS Take 1 tablet by mouth daily. 04/24/23  Yes [provider]  ATORVASTATIN CALCIUM PO Take by mouth.    [provider]  BUPROPION HCL PO Take by mouth.    [provider]  BUSPIRONE HCL PO Take by mouth.    [provider]  CALCIUM PO Take by mouth.    [provider]  cyclobenzaprine (FLEXERIL) 10 MG tablet Take 20 mg by mouth 3 (three) times daily.    [provider]  diclofenac Sodium (VOLTAREN) 1 % GEL Apply 2 g topically 4 (four) times daily as needed (muscle pain).    [provider]  gabapentin (NEURONTIN) 600 MG tablet Take 1,200 mg by mouth 3 (three) times daily.    [provider]  meloxicam (MOBIC) 15 MG tablet Take 15 mg by mouth daily. 07/08/19   [provider]  Multiple Vitamin (MULTIVITAMIN PO) Take by mouth.    [provider]  mupirocin ointment (BACTROBAN) 2 % Apply 1 Application topically 2 (two) times daily. 12/23/22   Raspet, Noberto Retort, PA-C  omeprazole (PRILOSEC) 40 MG capsule Take 40 mg by mouth daily.    [provider]  polyethylene glycol (MIRALAX MIX-IN PAX) 17 g packet Take 17 g by mouth daily as needed. 04/03/23   Bing Neighbors, NP    Family History Family History  Problem Relation Age  of Onset   Diabetes Mother     Social History Social History   Tobacco Use   Smoking status: Every Day    Current packs/day: 0.50    Types: Cigarettes   Smokeless tobacco: Never  Vaping Use   Vaping status: Never Used  Substance Use Topics   Alcohol use: Yes    Comment: 16 oz daily   Drug use: Yes    Types: Marijuana     Allergies   Tramadol   Review of Systems Review of Systems  Respiratory:  Positive for cough.    Per HPI  Physical Exam Triage Vital Signs ED Triage Vitals  Encounter Vitals Group     BP 01/21/24 1503 118/82     Systolic BP Percentile --      Diastolic BP Percentile --      Pulse Rate 01/21/24 1503 85     Resp 01/21/24 1503 16      Temp 01/21/24 1503 99 F (37.2 C)     Temp Source 01/21/24 1503 Oral     SpO2 01/21/24 1503 96 %     Weight 01/21/24 1503 180 lb (81.6 kg)     Height 01/21/24 1503 5' 9.75" (1.772 m)     Head Circumference --      Peak Flow --      Pain Score 01/21/24 1502 8     Pain Loc --      Pain Education --      Exclude from Growth Chart --    No data found.  Updated Vital Signs BP 118/82 (BP Location: Right Arm)   Pulse 85   Temp 99 F (37.2 C) (Oral)   Resp 16   Ht 5' 9.75" (1.772 m)   Wt 180 lb (81.6 kg)   SpO2 96%   BMI 26.01 kg/m   Visual Acuity Right Eye Distance:   Left Eye Distance:   Bilateral Distance:    Right Eye Near:   Left Eye Near:    Bilateral Near:     Physical Exam Vitals and nursing note reviewed.  Constitutional:      General: He is awake. He is not in acute distress.    Appearance: Normal appearance. He is well-developed and well-groomed. He is not ill-appearing.  HENT:     Right Ear: Tympanic membrane, ear canal and external ear normal.     Left Ear: Tympanic membrane, ear canal and external ear normal.     Nose: Congestion and rhinorrhea present.     Mouth/Throat:     Mouth: Mucous membranes are moist.     Pharynx: Posterior oropharyngeal erythema present. No oropharyngeal exudate.  Cardiovascular:     Rate and Rhythm: Normal rate and regular rhythm.  Pulmonary:     Effort: Pulmonary effort is normal.     Breath sounds: Normal breath sounds.  Musculoskeletal:        General: Normal range of motion.     Cervical back: Normal range of motion and neck supple.  Skin:    General: Skin is warm and dry.  Neurological:     Mental Status: He is alert.  Psychiatric:        Behavior: Behavior is cooperative.      UC Treatments / Results  Labs (all labs ordered are listed, but only abnormal results are displayed) Labs Reviewed - No data to display  EKG   Radiology DG Chest 2 View Result Date: 01/21/2024 CLINICAL DATA:  Cough, shortness of  breath,  wheezing, nasal drainage, chills, and body aches for 3 weeks. EXAM: CHEST - 2 VIEW COMPARISON:  01/23/2021 FINDINGS: Normal heart size and pulmonary vascularity. No focal airspace disease or consolidation in the lungs. No blunting of costophrenic angles. No pneumothorax. Mediastinal contours appear intact. IMPRESSION: No active cardiopulmonary disease. Electronically Signed   By: Burman Nieves M.D.   On: 01/21/2024 17:16    Procedures Procedures (including critical care time)  Medications Ordered in UC Medications - No data to display  Initial Impression / Assessment and Plan / UC Course  I have reviewed the triage vital signs and the nursing notes.  Pertinent labs & imaging results that were available during my care of the patient were reviewed by me and considered in my medical decision making (see chart for details).     Patient presented with cough, shortness of breath, intermittent wheezing, congestion, body aches, and chills x 2 weeks.   Upon assessment congestion ans rhinorrhea are present, mild erythema noted to pharynx. Lungs clear bilaterally on auscultation. Based on my interpretation, chest X-ray appearance was concerning for pneumonia. Radiologist did not see any active cardiopulmonary disease. Recommended patient continue with current treatment plan.   Prescribed Augmentin, Azithromycin, and prednisone for pneumonia coverage Prescribed Tessalon as needed for cough. Discussed OTC medication as needed for symptoms. Discussed return precautions.  Final Clinical Impressions(s) / UC Diagnoses   Final diagnoses:  Acute cough  Community acquired pneumonia, unspecified laterality     Discharge Instructions      Start taking Augmentin twice daily for 7 days. Also, start taking azithromycin by taking 2 tablets today and 1 tablet on the remaining 4 days.  Take prednisone once daily for 5 days.  I have also prescribed Tessalon that you can take every 8 hours as needed for  cough.   Otherwise alternate between Tylenol and ibuprofen as needed for pain and fever.  I also recommend taking Mucinex to help with cough and congestion.  Return here if symptoms persist or worsen.      ED Prescriptions     Medication Sig Dispense Auth. Provider   azithromycin (ZITHROMAX Z-PAK) 250 MG tablet Take 2 pills (500mg ) first day and one pill (250mg ) the remaining 4 days. 6 tablet Wynonia Lawman A, NP   predniSONE (DELTASONE) 20 MG tablet Take 2 tablets (40 mg total) by mouth daily for 5 days. 10 tablet Wynonia Lawman A, NP   benzonatate (TESSALON) 100 MG capsule Take 1 capsule (100 mg total) by mouth every 8 (eight) hours. 21 capsule Wynonia Lawman A, NP   amoxicillin-clavulanate (AUGMENTIN) 875-125 MG tablet Take 1 tablet by mouth every 12 (twelve) hours. 14 tablet Wynonia Lawman A, NP      PDMP not reviewed this encounter.   Wynonia Lawman A, NP 01/21/24 1736

## 2024-01-21 NOTE — ED Triage Notes (Signed)
 Patient here today with c/o cough, SOB, wheeze, nasal drainage, chills, and body aches X 2-3 weeks. Patient states that a few days ago he started to feel better but then yesterday symptoms worsened again. He has been taking an OTC cold medicine, a nasal spray, Tylenol, and Robitussin with little relief. Patient works with the public.

## 2024-01-21 NOTE — Discharge Instructions (Addendum)
 Start taking Augmentin twice daily for 7 days. Also, start taking azithromycin by taking 2 tablets today and 1 tablet on the remaining 4 days.  Take prednisone once daily for 5 days.  I have also prescribed Tessalon that you can take every 8 hours as needed for cough.   Otherwise alternate between Tylenol and ibuprofen as needed for pain and fever.  I also recommend taking Mucinex to help with cough and congestion.  Return here if symptoms persist or worsen.

## 2024-01-23 ENCOUNTER — Emergency Department (HOSPITAL_COMMUNITY): Payer: No Typology Code available for payment source

## 2024-01-23 ENCOUNTER — Encounter (HOSPITAL_COMMUNITY): Payer: Self-pay

## 2024-01-23 ENCOUNTER — Emergency Department (HOSPITAL_COMMUNITY)
Admission: EM | Admit: 2024-01-23 | Discharge: 2024-01-23 | Disposition: A | Discharge status: Home / Self Care | Payer: No Typology Code available for payment source | Attending: Emergency Medicine | Admitting: Emergency Medicine

## 2024-01-23 ENCOUNTER — Other Ambulatory Visit: Payer: Self-pay

## 2024-01-23 DIAGNOSIS — I10 Essential (primary) hypertension: Secondary | ICD-10-CM | POA: Diagnosis not present

## 2024-01-23 DIAGNOSIS — J101 Influenza due to other identified influenza virus with other respiratory manifestations: Secondary | ICD-10-CM | POA: Insufficient documentation

## 2024-01-23 DIAGNOSIS — R059 Cough, unspecified: Secondary | ICD-10-CM | POA: Diagnosis present

## 2024-01-23 LAB — CBC WITH DIFFERENTIAL/PLATELET
Abs Immature Granulocytes: 0.02 10*3/uL (ref 0.00–0.07)
Basophils Absolute: 0 10*3/uL (ref 0.0–0.1)
Basophils Relative: 1 %
Eosinophils Absolute: 0 10*3/uL (ref 0.0–0.5)
Eosinophils Relative: 0 %
HCT: 44 % (ref 39.0–52.0)
Hemoglobin: 14.9 g/dL (ref 13.0–17.0)
Immature Granulocytes: 0 %
Lymphocytes Relative: 11 %
Lymphs Abs: 0.8 10*3/uL (ref 0.7–4.0)
MCH: 32.2 pg (ref 26.0–34.0)
MCHC: 33.9 g/dL (ref 30.0–36.0)
MCV: 95 fL (ref 80.0–100.0)
Monocytes Absolute: 0.3 10*3/uL (ref 0.1–1.0)
Monocytes Relative: 4 %
Neutro Abs: 5.5 10*3/uL (ref 1.7–7.7)
Neutrophils Relative %: 84 %
Platelets: 117 10*3/uL — ABNORMAL LOW (ref 150–400)
RBC: 4.63 MIL/uL (ref 4.22–5.81)
RDW: 13.6 % (ref 11.5–15.5)
WBC: 6.6 10*3/uL (ref 4.0–10.5)
nRBC: 0 % (ref 0.0–0.2)

## 2024-01-23 LAB — BASIC METABOLIC PANEL
Anion gap: 9 (ref 5–15)
BUN: 15 mg/dL (ref 6–20)
CO2: 23 mmol/L (ref 22–32)
Calcium: 8.7 mg/dL — ABNORMAL LOW (ref 8.9–10.3)
Chloride: 105 mmol/L (ref 98–111)
Creatinine, Ser: 0.79 mg/dL (ref 0.61–1.24)
GFR, Estimated: >60 mL/min (ref >60)
Glucose, Bld: 106 mg/dL — ABNORMAL HIGH (ref 70–99)
Potassium: 3.4 mmol/L — ABNORMAL LOW (ref 3.5–5.1)
Sodium: 137 mmol/L (ref 135–145)

## 2024-01-23 LAB — RESP PANEL BY RT-PCR (RSV, FLU A&B, COVID)  RVPGX2
Influenza A by PCR: POSITIVE — AB
Influenza B by PCR: NEGATIVE
Resp Syncytial Virus by PCR: NEGATIVE
SARS Coronavirus 2 by RT PCR: NEGATIVE

## 2024-01-23 LAB — I-STAT CG4 LACTIC ACID, ED: Lactic Acid, Venous: 1.5 mmol/L (ref 0.5–1.9)

## 2024-01-23 MED ORDER — ACETAMINOPHEN 325 MG PO TABS
650.0000 mg | ORAL_TABLET | Freq: Once | ORAL | Status: AC
  Administered 2024-01-23: 650 mg via ORAL
  Filled 2024-01-23: qty 2

## 2024-01-23 MED ORDER — ONDANSETRON 4 MG PO TBDP: 4.0000 mg | ORAL_TABLET | Freq: Three times a day (TID) | ORAL | 0 refills | Status: AC | PRN

## 2024-01-23 MED ORDER — GUAIFENESIN-CODEINE 100-10 MG/5ML PO SOLN: 5.0000 mL | Freq: Three times a day (TID) | ORAL | 0 refills | Status: AC | PRN

## 2024-01-23 MED ORDER — ACETAMINOPHEN 325 MG PO TABS: 650.0000 mg | ORAL_TABLET | Freq: Four times a day (QID) | ORAL | Status: DC | PRN

## 2024-01-23 NOTE — ED Triage Notes (Signed)
 Dx with PNA last week and placed on antibiotics btu reports not getting better.  REports cough and fever still.  Patient was not tested for flu or covid.  Patient reports chest congestion but can't get it to come up.

## 2024-01-23 NOTE — ED Provider Notes (Signed)
 [place] [facility] AT [facility] Provider Note   CSN: [id number] Arrival date & time: 01/23/24  1016     History  Chief Complaint  Patient presents with   Cough    Don Washington is a 53 y.o. male.  Patient with history of hypertension presents today with complaints of cough and congestion.  States that he has had several rounds of URI symptoms over the last few weeks.  Symptoms started to improve later last week, however returned over the weekend.  2 days ago he went to urgent care and performed a chest x-ray that was clear, however provider treated for pneumonia with Augmentin and azithromycin, steroids, and Tessalon.  Patient was not swabbed for COVID or flu at this visit.  States he is taking this medication with minimal relief.  Denies chest pain or shortness of breath.  Did have 1 episode of vomiting yesterday due to posttussive emesis.  No abdominal pain.  The history is provided by the patient. No language interpreter was used.  Cough Associated symptoms: fever        Home Medications Prior to Admission medications   Medication Sig Start Date End Date Taking? Authorizing Provider  guaiFENesin-codeine 100-10 MG/5ML syrup Take 5 mLs by mouth 3 (three) times daily as needed for cough. 01/23/24  Yes Jalicia Roszak A, PA-C  ondansetron (ZOFRAN-ODT) 4 MG disintegrating tablet Take 1 tablet (4 mg total) by mouth every 8 (eight) hours as needed for nausea or vomiting. 01/23/24  Yes Coltin Casher A, PA-C  amoxicillin-clavulanate (AUGMENTIN) 875-125 MG tablet Take 1 tablet by mouth every 12 (twelve) hours. 01/21/24   Wynonia Lawman A, NP  aspirin EC 81 MG tablet Take 81 mg by mouth daily. 09/10/23   [provider]  ATORVASTATIN CALCIUM PO Take by mouth.    [provider]  azithromycin (ZITHROMAX Z-PAK) 250 MG tablet Take 2 pills (500mg ) first day and one pill (250mg ) the remaining 4 days. 01/21/24   Wynonia Lawman A, NP  benzonatate (TESSALON)  100 MG capsule Take 1 capsule (100 mg total) by mouth every 8 (eight) hours. 01/21/24   Wynonia Lawman A, NP  BUPROPION HCL PO Take by mouth.    [provider]  BUSPIRONE HCL PO Take by mouth.    [provider]  CALCIUM PO Take by mouth.    [provider]  cyclobenzaprine (FLEXERIL) 10 MG tablet Take 20 mg by mouth 3 (three) times daily.    [provider]  diclofenac Sodium (VOLTAREN) 1 % GEL Apply 2 g topically 4 (four) times daily as needed (muscle pain).    [provider]  gabapentin (NEURONTIN) 600 MG tablet Take 1,200 mg by mouth 3 (three) times daily.    [provider]  meloxicam (MOBIC) 15 MG tablet Take 15 mg by mouth daily. 07/08/19   [provider]  Multiple Vitamin (MULTIVITAMIN PO) Take by mouth.    [provider]  mupirocin ointment (BACTROBAN) 2 % Apply 1 Application topically 2 (two) times daily. 12/23/22   Raspet, Noberto Retort, PA-C  omeprazole (PRILOSEC) 40 MG capsule Take 40 mg by mouth daily.    [provider]  polyethylene glycol (MIRALAX MIX-IN PAX) 17 g packet Take 17 g by mouth daily as needed. 04/03/23   Bing Neighbors, NP  predniSONE (DELTASONE) 20 MG tablet Take 2 tablets (40 mg total) by mouth daily for 5 days. 01/21/24 01/26/24  Letta Kocher, NP  Vitamin D, Cholecalciferol, 25  MCG (1000 UT) CAPS Take 1 tablet by mouth daily. 04/24/23   [provider]      Allergies    Tramadol    Review of Systems   Review of Systems  Constitutional:  Positive for fever.  HENT:  Positive for congestion.   Respiratory:  Positive for cough.   All other systems reviewed and are negative.   Physical Exam Updated Vital Signs BP 112/71 (BP Location: Left Arm)   Pulse (!) 101   Temp (!) 101.5 F (38.6 C)   Resp 18   Ht 5\' 10"  (1.778 m)   Wt 81.6 kg   SpO2 95%   BMI 25.83 kg/m  Physical Exam Vitals and nursing note reviewed.  Constitutional:      General: He is not in acute  distress.    Appearance: Normal appearance. He is normal weight. He is not ill-appearing, toxic-appearing or diaphoretic.  HENT:     Head: Normocephalic and atraumatic.  Cardiovascular:     Rate and Rhythm: Normal rate and regular rhythm.     Heart sounds: Normal heart sounds.  Pulmonary:     Effort: Pulmonary effort is normal. No respiratory distress.     Breath sounds: Normal breath sounds.  Abdominal:     General: Abdomen is flat.     Palpations: Abdomen is soft.     Tenderness: There is no abdominal tenderness.  Musculoskeletal:        General: No tenderness. Normal range of motion.     Cervical back: Normal range of motion.     Right lower leg: No edema.     Left lower leg: No edema.  Skin:    General: Skin is warm and dry.  Neurological:     General: No focal deficit present.     Mental Status: He is alert.  Psychiatric:        Mood and Affect: Mood normal.        Behavior: Behavior normal.     ED Results / Procedures / Treatments   Labs (all labs ordered are listed, but only abnormal results are displayed) Labs Reviewed  RESP PANEL BY RT-PCR (RSV, FLU A&B, COVID)  RVPGX2 - Abnormal; Notable for the following components:      Result Value   Influenza A by PCR POSITIVE (*)    All other components within normal limits  CBC WITH DIFFERENTIAL/PLATELET - Abnormal; Notable for the following components:   Platelets 117 (*)    All other components within normal limits  BASIC METABOLIC PANEL - Abnormal; Notable for the following components:   Potassium 3.4 (*)    Glucose, Bld 106 (*)    Calcium 8.7 (*)    All other components within normal limits  I-STAT CG4 LACTIC ACID, ED    EKG None  Radiology DG Chest 2 View Result Date: 01/23/2024 CLINICAL DATA:  Cough for the past 2 weeks. Right-sided chest pain today. EXAM: CHEST - 2 VIEW COMPARISON:  Chest x-ray dated January 21, 2024. FINDINGS: The heart size and mediastinal contours are within normal limits. Both lungs  are clear. The visualized skeletal structures are unremarkable. IMPRESSION: No active cardiopulmonary disease. Electronically Signed   By: Obie Dredge M.D.   On: 01/23/2024 11:45    Procedures Procedures    Medications Ordered in ED Medications  acetaminophen (TYLENOL) tablet 650 mg (has no administration in time range)  acetaminophen (TYLENOL) tablet 650 mg (650 mg Oral Given 01/23/24 1635)    ED Course/ Medical  Decision Making/ A&P                                 Medical Decision Making Amount and/or Complexity of Data Reviewed Labs: ordered. Radiology: ordered.  Risk OTC drugs. Prescription drug management.   This patient is a 53 y.o. male who presents to the ED for concern of cough and congestion, this involves an extensive number of treatment options, and is a complaint that carries with it a high risk of complications and morbidity. The emergent differential diagnosis prior to evaluation includes, but is not limited to,  Upper respiratory infection, lower respiratory infection, allergies/irritants, asthma, reflux, CHF, interstitial lung disease, foreign body, ACE inhibitors   This is not an exhaustive differential.   Past Medical History / Co-morbidities / Social History:  has a past medical history of Arthritis, Colon polyps, DDD (degenerative disc disease), lumbar, GERD (gastroesophageal reflux disease), Hypercholesteremia, Hypertension, and Pre-diabetes.  Additional history: Chart reviewed. Pertinent results include: Seen at urgent care on 2/24, had a clear chest x-ray but diagnosed with pneumonia.  No viral swab was collected.  Was treated with azithromycin, Augmentin, Tessalon, and prednisone.  Physical Exam: Physical exam performed. The pertinent findings include: Coughing on exam, sounds like a dry cough but otherwise well-appearing, speaking in complete sentences.  Lung sounds clear to auscultation in all fields.  Lab Tests: I ordered, and personally  interpreted labs.  The pertinent results include: Flu +, no leukocytosis, K 3.4    Imaging Studies: I ordered imaging studies including CXR. I independently visualized and interpreted imaging which showed NAD. I agree with the radiologist interpretation.   Medications: I ordered medication including tylenol  for fever. Reevaluation of the patient after these medicines showed that the patient improved. I have reviewed the patients home medicines and have made adjustments as needed.   Disposition: After consideration of the diagnostic results and the patients response to treatment, I feel that emergency department workup does not suggest an emergent condition requiring admission or immediate intervention beyond what has been performed at this time.   Patient is nontoxic-appearing, and in no acute distress with reassuring vital signs. Febrile at 101.5, given tylenol for same.  He is flu, positive, symptoms consistent with this. Discussed with patient that there is no indication for antibiotics for viral infections.  He is out of the window for Tamiflu.  He did not get any improvement with Tessalon given at urgent care, will give codeine cough syrup.  PDMP reviewed.  Patient advised not to drive or operate machinery while taking this medication.  He does have a listed allergy to tramadol, however he notes that it only makes him mildly nauseous.  Suspect this is not a real allergy.  Discussed this with patient, he would like to try the codeine cough syrup despite this.  He has had a few episodes of posttussive emesis, will send for Zofran for this as well.  He is currently not nauseous and is able to eat and drink without any residual nausea or vomiting.   Evaluation and diagnostic testing in the emergency department does not suggest an emergent condition requiring admission or immediate intervention beyond what has been performed at this time.  Plan for discharge with close PCP follow-up.  Patient is  understanding and amenable with plan, educated on red flag symptoms that would prompt immediate return.  Patient discharged in stable condition.   Final Clinical Impression(s) / ED  Diagnoses Final diagnoses:  Influenza A    Rx / DC Orders ED Discharge Orders          Ordered    ondansetron (ZOFRAN-ODT) 4 MG disintegrating tablet  Every 8 hours PRN        01/23/24 1639    guaiFENesin-codeine 100-10 MG/5ML syrup  3 times daily PRN        01/23/24 1639          An After Visit Summary was printed and given to the patient.     Silva Bandy, PA-C 01/23/24 1652    Anders Simmonds T, DO 01/24/24 [phone]

## 2024-01-23 NOTE — Discharge Instructions (Signed)
 As we discussed, you tested positive for the flu today.  As this is a viral infection, no antibiotics are indicated.  I recommend that you get plenty of rest and focus on symptomatic relief which includes Cepacol throat lozenges for sore throat, Mucinex for congestion, and tylenol/ibuprofen as needed for fevers and bodyaches. I have also given you a prescription for codeine cough syrup and Zofran for your cough as well as nausea and vomiting.  Please take these as prescribed as needed.  Do not drive or operate heavy machinery while taking the codeine syrup as it can be sedating.  It is best to take this when you are trying to fall asleep at night.  I also recommend:  Increased fluid intake. Sports drinks offer valuable electrolytes, sugars, and fluids.  Breathing heated mist or steam (vaporizer or shower).  Eating chicken soup or other clear broths, and maintaining good nutrition.   Increasing usage of your inhaler if you have asthma.  Return to work when your temperature has returned to normal.  Gargle warm salt water and spit it out for sore throat. Take benadryl or Zyrtec to decrease sinus secretions.  Follow Up: Follow up with your primary care doctor in 5-7 days for recheck of ongoing symptoms.  Return to emergency department for emergent changing or worsening of symptoms.

## 2024-01-23 NOTE — ED Provider Triage Note (Signed)
 Emergency Medicine Provider Triage Evaluation Note  Don Washington , a 53 y.o. male  was evaluated in triage.  Pt complains of cough and fatigue ongoing for a few days.  Patient says that he felt similarly few weeks ago, started to get better for a few days, and then symptoms returned.  Went to an urgent care who prescribed him Augmentin and azithromycin for any acquired pneumonia.  Patient's chest x-ray at urgent care did not show pneumonia..  Review of Systems  Positive:  Negative:   Physical Exam  BP 124/80 (BP Location: Right Arm)   Pulse (!) 109   Temp 100.2 F (37.9 C) (Oral)   Resp 20   Ht 5\' 10"  (1.778 m)   Wt 81.6 kg   SpO2 98%   BMI 25.83 kg/m  Gen:   Awake, no distress   Resp:  Normal effort  MSK:   Moves extremities without difficulty  Other:    Medical Decision Making  Medically screening exam initiated at 11:21 AM.  Appropriate orders placed.  Don Washington was informed that the remainder of the evaluation will be completed by another provider, this initial triage assessment does not replace that evaluation, and the importance of remaining in the ED until their evaluation is complete.  Viral swabs ordered, basic blood work and chest x-ray ordered.  Patient overall looks well.   Don Washington T, DO 01/23/24 1122

## 2024-06-29 ENCOUNTER — Encounter (HOSPITAL_COMMUNITY): Payer: Self-pay | Admitting: Pharmacy Technician

## 2024-06-29 ENCOUNTER — Other Ambulatory Visit: Payer: Self-pay

## 2024-06-29 ENCOUNTER — Emergency Department (HOSPITAL_COMMUNITY)

## 2024-06-29 ENCOUNTER — Emergency Department (HOSPITAL_COMMUNITY): Admission: EM | Admit: 2024-06-29 | Discharge: 2024-06-29 | Disposition: A | Discharge status: Home / Self Care

## 2024-06-29 DIAGNOSIS — R1084 Generalized abdominal pain: Secondary | ICD-10-CM | POA: Diagnosis present

## 2024-06-29 DIAGNOSIS — Z7982 Long term (current) use of aspirin: Secondary | ICD-10-CM | POA: Diagnosis not present

## 2024-06-29 DIAGNOSIS — R109 Unspecified abdominal pain: Secondary | ICD-10-CM

## 2024-06-29 DIAGNOSIS — R112 Nausea with vomiting, unspecified: Secondary | ICD-10-CM | POA: Insufficient documentation

## 2024-06-29 DIAGNOSIS — R079 Chest pain, unspecified: Secondary | ICD-10-CM

## 2024-06-29 LAB — COMPREHENSIVE METABOLIC PANEL WITH GFR
ALT: 20 U/L (ref 0–44)
AST: 20 U/L (ref 15–41)
Albumin: 4.4 g/dL (ref 3.5–5.0)
Alkaline Phosphatase: 83 U/L (ref 38–126)
Anion gap: 9 (ref 5–15)
BUN: 8 mg/dL (ref 6–20)
CO2: 25 mmol/L (ref 22–32)
Calcium: 9.5 mg/dL (ref 8.9–10.3)
Chloride: 105 mmol/L (ref 98–111)
Creatinine, Ser: 0.83 mg/dL (ref 0.61–1.24)
GFR, Estimated: >60 mL/min (ref >60)
Glucose, Bld: 89 mg/dL (ref 70–99)
Potassium: 3.8 mmol/L (ref 3.5–5.1)
Sodium: 139 mmol/L (ref 135–145)
Total Bilirubin: 0.7 mg/dL (ref 0.0–1.2)
Total Protein: 7.7 g/dL (ref 6.5–8.1)

## 2024-06-29 LAB — URINALYSIS, ROUTINE W REFLEX MICROSCOPIC
Bacteria, UA: NONE SEEN
Bilirubin Urine: NEGATIVE
Glucose, UA: NEGATIVE mg/dL
Hgb urine dipstick: NEGATIVE
Ketones, ur: 5 mg/dL — AB
Leukocytes,Ua: NEGATIVE
Nitrite: NEGATIVE
Protein, ur: 30 mg/dL — AB
Specific Gravity, Urine: 1.024 (ref 1.005–1.030)
pH: 6 (ref 5.0–8.0)

## 2024-06-29 LAB — CBC
HCT: 45.3 % (ref 39.0–52.0)
Hemoglobin: 15.3 g/dL (ref 13.0–17.0)
MCH: 32.2 pg (ref 26.0–34.0)
MCHC: 33.8 g/dL (ref 30.0–36.0)
MCV: 95.4 fL (ref 80.0–100.0)
Platelets: 156 K/uL (ref 150–400)
RBC: 4.75 MIL/uL (ref 4.22–5.81)
RDW: 13.1 % (ref 11.5–15.5)
WBC: 5.3 K/uL (ref 4.0–10.5)
nRBC: 0 % (ref 0.0–0.2)

## 2024-06-29 LAB — TROPONIN I (HIGH SENSITIVITY)
Troponin I (High Sensitivity): 3 ng/L (ref <18)
Troponin I (High Sensitivity): 3 ng/L (ref <18)

## 2024-06-29 LAB — LIPASE, BLOOD: Lipase: 32 U/L (ref 11–51)

## 2024-06-29 MED ORDER — PANTOPRAZOLE SODIUM 20 MG PO TBEC: 20.0000 mg | DELAYED_RELEASE_TABLET | Freq: Every day | ORAL | 0 refills | Status: AC

## 2024-06-29 MED ORDER — SUCRALFATE 1 G PO TABS
1.0000 g | ORAL_TABLET | Freq: Once | ORAL | Status: AC
  Administered 2024-06-29: 1 g via ORAL
  Filled 2024-06-29: qty 1

## 2024-06-29 MED ORDER — ALUM & MAG HYDROXIDE-SIMETH 200-200-20 MG/5ML PO SUSP
30.0000 mL | Freq: Once | ORAL | Status: AC
  Administered 2024-06-29: 30 mL via ORAL
  Filled 2024-06-29: qty 30

## 2024-06-29 MED ORDER — ONDANSETRON 4 MG PO TBDP
4.0000 mg | ORAL_TABLET | Freq: Three times a day (TID) | ORAL | 0 refills | Status: AC | PRN
Start: 2024-06-29 — End: ?

## 2024-06-29 MED ORDER — PANTOPRAZOLE SODIUM 40 MG PO TBEC
40.0000 mg | DELAYED_RELEASE_TABLET | Freq: Once | ORAL | Status: AC
  Administered 2024-06-29: 40 mg via ORAL
  Filled 2024-06-29: qty 1

## 2024-06-29 MED ORDER — ONDANSETRON 4 MG PO TBDP
4.0000 mg | ORAL_TABLET | Freq: Once | ORAL | Status: AC
  Administered 2024-06-29: 4 mg via ORAL
  Filled 2024-06-29: qty 1

## 2024-06-29 MED ORDER — IOHEXOL 350 MG/ML SOLN
75.0000 mL | Freq: Once | INTRAVENOUS | Status: AC | PRN
  Administered 2024-06-29: 75 mL via INTRAVENOUS

## 2024-06-29 NOTE — Discharge Instructions (Addendum)
 Please follow-up with your primary doctor.  You may take the medications we have prescribed you for symptomatic relief.  We are also referring you to a cardiologist for further testing.  Return if felt fevers, chills, severe headache, lightheadedness, passout, feel your heart is racing, worsening chest pain, shortness of breath or difficulty breathing or any new or worsening symptoms that are concerning to you.

## 2024-06-29 NOTE — ED Notes (Signed)
 Reviewed D/C information with the patient, pt verbalized understanding. No additional concerns at this time.

## 2024-06-29 NOTE — ED Triage Notes (Signed)
 Abd pain, vomiting, acid reflux started yesterday. Today vomited several times and chest pain primarily on left side does not radiate.

## 2024-06-29 NOTE — ED Provider Notes (Signed)
 [place] EMERGENCY DEPARTMENT AT [facility] Provider Note   CSN: [id number] Arrival date & time: 06/29/24  1805     Patient presents with: No chief complaint on file.   Don Washington is a 53 y.o. male.   This is a 53 year old male who presented to the emergency department for abdominal pain and chest pain.  Reports that he has had some intermittent chest pain over the past week, left-sided.  Yesterday began having reflux/GERD with generalized abdominal pain with several episodes of nausea vomiting.  Started having chest pain again and route to hospital.  Denies prior cardiac history.        Prior to Admission medications   Medication Sig Start Date End Date Taking? Authorizing Provider  amoxicillin -clavulanate (AUGMENTIN ) 875-125 MG tablet Take 1 tablet by mouth every 12 (twelve) hours. 01/21/24   Johnie Flaming A, NP  aspirin EC 81 MG tablet Take 81 mg by mouth daily. 09/10/23   [provider]  ATORVASTATIN CALCIUM PO Take by mouth.    [provider]  azithromycin  (ZITHROMAX  Z-PAK) 250 MG tablet Take 2 pills (500mg ) first day and one pill (250mg ) the remaining 4 days. 01/21/24   Johnie Flaming A, NP  benzonatate  (TESSALON ) 100 MG capsule Take 1 capsule (100 mg total) by mouth every 8 (eight) hours. 01/21/24   Johnie Flaming A, NP  BUPROPION HCL PO Take by mouth.    [provider]  BUSPIRONE HCL PO Take by mouth.    [provider]  CALCIUM PO Take by mouth.    [provider]  cyclobenzaprine (FLEXERIL) 10 MG tablet Take 20 mg by mouth 3 (three) times daily.    [provider]  diclofenac Sodium (VOLTAREN) 1 % GEL Apply 2 g topically 4 (four) times daily as needed (muscle pain).    [provider]  gabapentin (NEURONTIN) 600 MG tablet Take 1,200 mg by mouth 3 (three) times daily.    [provider]  guaiFENesin -codeine  100-10 MG/5ML syrup Take 5 mLs by mouth 3 (three) times daily as needed for  cough. 01/23/24   Smoot, Lauraine LABOR, PA-C  meloxicam (MOBIC) 15 MG tablet Take 15 mg by mouth daily. 07/08/19   [provider]  Multiple Vitamin (MULTIVITAMIN PO) Take by mouth.    [provider]  mupirocin  ointment (BACTROBAN ) 2 % Apply 1 Application topically 2 (two) times daily. 12/23/22   Raspet, Erin K, PA-C  omeprazole (PRILOSEC) 40 MG capsule Take 40 mg by mouth daily.    [provider]  ondansetron  (ZOFRAN -ODT) 4 MG disintegrating tablet Take 1 tablet (4 mg total) by mouth every 8 (eight) hours as needed for nausea or vomiting. 01/23/24   Smoot, Lauraine LABOR, PA-C  polyethylene glycol (MIRALAX  MIX-IN PAX) 17 g packet Take 17 g by mouth daily as needed. 04/03/23   Arloa Suzen RAMAN, NP  Vitamin D, Cholecalciferol, 25 MCG (1000 UT) CAPS Take 1 tablet by mouth daily. 04/24/23   [provider]    Allergies: Tramadol    Review of Systems  Updated Vital Signs BP 114/78 (BP Location: Right Arm)   Pulse 71   Temp 98.1 F (36.7 C) (Oral)   Resp 20   SpO2 100%   Physical Exam Vitals and nursing note reviewed.  Constitutional:      General: He is not in acute distress. HENT:     Head: Normocephalic.     Mouth/Throat:     Mouth: Mucous membranes are moist.  Eyes:  Conjunctiva/sclera: Conjunctivae normal.  Cardiovascular:     Rate and Rhythm: Normal rate and regular rhythm.  Pulmonary:     Effort: Pulmonary effort is normal.     Breath sounds: Normal breath sounds.  Abdominal:     General: Abdomen is flat. There is no distension.     Palpations: Abdomen is soft.     Tenderness: There is no abdominal tenderness. There is no guarding or rebound.  Musculoskeletal:     Cervical back: Normal range of motion.     Right lower leg: No edema.     Left lower leg: No edema.  Skin:    General: Skin is warm and dry.     Capillary Refill: Capillary refill takes less than 2 seconds.  Neurological:     Mental Status: He is alert and oriented to person, place,  and time.  Psychiatric:        Mood and Affect: Mood normal.        Behavior: Behavior normal.     (all labs ordered are listed, but only abnormal results are displayed) Labs Reviewed  URINALYSIS, ROUTINE W REFLEX MICROSCOPIC - Abnormal; Notable for the following components:      Result Value   Color, Urine AMBER (*)    Ketones, ur 5 (*)    Protein, ur 30 (*)    All other components within normal limits  LIPASE, BLOOD  COMPREHENSIVE METABOLIC PANEL WITH GFR  CBC  TROPONIN I (HIGH SENSITIVITY)  TROPONIN I (HIGH SENSITIVITY)    EKG: EKG Interpretation Date/Time:  Sunday June 29 2024 18:15:56 EDT Ventricular Rate:  78 PR Interval:  188 QRS Duration:  96 QT Interval:  370 QTC Calculation: 421 R Axis:   76  Text Interpretation: Normal sinus rhythm Normal ECG When compared with ECG of 03-Apr-2023 10:45, PREVIOUS ECG IS PRESENT Confirmed by Neysa Clap [phone]) on 06/29/2024 8:24:38 PM  Radiology: DG Chest 2 View Result Date: 06/29/2024 CLINICAL DATA:  Chest pain. EXAM: CHEST - 2 VIEW COMPARISON:  01/23/2024. FINDINGS: The heart size and mediastinal contours are within normal limits. No focal consolidation, pleural effusion, or pneumothorax. No acute osseous abnormality. IMPRESSION: No acute cardiopulmonary findings. Electronically Signed   By: Harrietta Sherry M.D.   On: 06/29/2024 19:35     Procedures   Medications Ordered in the ED  alum & mag hydroxide-simeth (MAALOX/MYLANTA) 200-200-20 MG/5ML suspension 30 mL (30 mLs Oral Given 06/29/24 2101)  ondansetron  (ZOFRAN -ODT) disintegrating tablet 4 mg (4 mg Oral Given 06/29/24 2101)  sucralfate  (CARAFATE ) tablet 1 g (1 g Oral Given 06/29/24 2101)  iohexol  (OMNIPAQUE ) 350 MG/ML injection 75 mL (75 mLs Intravenous Contrast Given 06/29/24 2240)    Clinical Course as of 06/29/24 2301  Sun Jun 29, 2024  2231 Troponin I (High Sensitivity): 3 Negative x 2 [TY]    Clinical Course User Index [TY] Neysa Clap PARAS, DO                                  Medical Decision Making This is a 53 year old male presenting emergency department with abdominal pain and chest pain.  He is afebrile nontachycardic, and has been hemodynamically stable.  Maintaining oxygen saturation on room air.  Does not appear to be in distress and is playing comfortably on his phone in the room.  Equal pulses.  Chest x-ray without pneumonia pneumothorax.  His EKG appears to be sinus rhythm without ST segment changes  to indicate ischemia my independent interpretation.  Troponins are negative x 2.  Low risk heart score.  ACS less likely.  He has no metabolic derangements.  Normal kidney function.  No transaminitis to suggest hepatobiliary disease.  Lipase is normal.  No fever or tachycardia or leukocytosis to suggest systemic infection.  Hemoglobin is normal.  UA without evidence of infection.  Was treated with GI cocktail, Zofran  and and sucralfate .  He was recently admitted to the [facility] for mental health reasons and was sedentary then, but no other risk factors for PE.  Soft abdomen, but does have some tenderness and notes that he has been constipated.  Will get CTA to rule out PE given recent hospitalization as well as CT abdomen to evaluate for obstructive process.  If scans are negative, discharged with outpatient follow-up.  Amount and/or Complexity of Data Reviewed External Data Reviewed:     Details: No prior cardiac history Labs: ordered. Decision-making details documented in ED Course.    Details: See above Radiology: ordered and independent interpretation performed.    Details: See above ECG/medicine tests:     Details: See above  Risk OTC drugs. Prescription drug management. Decision regarding hospitalization. Diagnosis or treatment significantly limited by social determinants of health. Risk Details: Poor health literacy      Final diagnoses:  None    ED Discharge Orders     None          Neysa Caron PARAS, DO 06/29/24 2355

## 2024-06-29 NOTE — ED Notes (Signed)
 Patient transported to CT

## 2024-07-18 ENCOUNTER — Emergency Department (HOSPITAL_COMMUNITY)

## 2024-07-18 ENCOUNTER — Other Ambulatory Visit: Payer: Self-pay

## 2024-07-18 ENCOUNTER — Emergency Department (HOSPITAL_COMMUNITY)
Admission: EM | Admit: 2024-07-18 | Discharge: 2024-07-18 | Disposition: A | Discharge status: Home / Self Care | Attending: Emergency Medicine | Admitting: Emergency Medicine

## 2024-07-18 DIAGNOSIS — Z7982 Long term (current) use of aspirin: Secondary | ICD-10-CM | POA: Diagnosis not present

## 2024-07-18 DIAGNOSIS — M25552 Pain in left hip: Secondary | ICD-10-CM | POA: Diagnosis present

## 2024-07-18 MED ORDER — KETOROLAC TROMETHAMINE 15 MG/ML IJ SOLN
15.0000 mg | Freq: Once | INTRAMUSCULAR | Status: AC
  Administered 2024-07-18: 15 mg via INTRAMUSCULAR
  Filled 2024-07-18: qty 1

## 2024-07-18 NOTE — Discharge Instructions (Signed)
 Follow-up with your orthopedist.  X-ray did not show any concerning findings but did show some degenerative change.  You received a shot of Toradol  in the emergency department.  Return for any emergent symptoms.

## 2024-07-18 NOTE — ED Provider Triage Note (Signed)
 Emergency Medicine Provider Triage Evaluation Note  Don Washington , a 53 y.o. male  was evaluated in triage.  Pt complains of left hip pain.  He states this has been worsening over the past couple years.  Previous history of right hip replacement.  Review of Systems  Positive: As above Negative: As above  Physical Exam  BP 113/79 (BP Location: Left Arm)   Pulse 79   Temp 98.2 F (36.8 C) (Oral)   Resp 18   SpO2 99%  Gen:   Awake, no distress   Resp:  Normal effort  MSK:   Moves extremities without difficulty  Other:    Medical Decision Making  Medically screening exam initiated at 6:45 PM.  Appropriate orders placed.  Don Washington was informed that the remainder of the evaluation will be completed by another provider, this initial triage assessment does not replace that evaluation, and the importance of remaining in the ED until their evaluation is complete.    Hildegard Loge, PA-C 07/18/24 [phone]

## 2024-07-18 NOTE — ED Provider Notes (Signed)
 [place] EMERGENCY DEPARTMENT AT [facility] Provider Note   CSN: [id number] Arrival date & time: 07/18/24  1801     Patient presents with: Hip Pain   Don Washington is a 53 y.o. male.   Pt complains of left hip pain.  He states this has been worsening over the past couple years.  Previous history of right hip replacement.  No recent injury.  Denies fever.  States he was told eventually he may need left hip replacement and believes he is nearing that time.  Has not seen his orthopedist.  The history is provided by the patient. No language interpreter was used.       Prior to Admission medications   Medication Sig Start Date End Date Taking? Authorizing Provider  amoxicillin -clavulanate (AUGMENTIN ) 875-125 MG tablet Take 1 tablet by mouth every 12 (twelve) hours. 01/21/24   Johnie Flaming A, NP  aspirin EC 81 MG tablet Take 81 mg by mouth daily. 09/10/23   [provider]  ATORVASTATIN CALCIUM PO Take by mouth.    [provider]  azithromycin  (ZITHROMAX  Z-PAK) 250 MG tablet Take 2 pills (500mg ) first day and one pill (250mg ) the remaining 4 days. 01/21/24   Johnie Flaming A, NP  benzonatate  (TESSALON ) 100 MG capsule Take 1 capsule (100 mg total) by mouth every 8 (eight) hours. 01/21/24   Johnie Flaming A, NP  BUPROPION HCL PO Take by mouth.    [provider]  BUSPIRONE HCL PO Take by mouth.    [provider]  CALCIUM PO Take by mouth.    [provider]  cyclobenzaprine (FLEXERIL) 10 MG tablet Take 20 mg by mouth 3 (three) times daily.    [provider]  diclofenac Sodium (VOLTAREN) 1 % GEL Apply 2 g topically 4 (four) times daily as needed (muscle pain).    [provider]  gabapentin (NEURONTIN) 600 MG tablet Take 1,200 mg by mouth 3 (three) times daily.    [provider]  guaiFENesin -codeine  100-10 MG/5ML syrup Take 5 mLs by mouth 3 (three) times daily as needed for cough. 01/23/24   Smoot,  Lauraine LABOR, PA-C  meloxicam (MOBIC) 15 MG tablet Take 15 mg by mouth daily. 07/08/19   [provider]  Multiple Vitamin (MULTIVITAMIN PO) Take by mouth.    [provider]  mupirocin  ointment (BACTROBAN ) 2 % Apply 1 Application topically 2 (two) times daily. 12/23/22   Raspet, Erin K, PA-C  omeprazole (PRILOSEC) 40 MG capsule Take 40 mg by mouth daily.    [provider]  ondansetron  (ZOFRAN -ODT) 4 MG disintegrating tablet Take 1 tablet (4 mg total) by mouth every 8 (eight) hours as needed for nausea or vomiting. 01/23/24   Smoot, Lauraine LABOR, PA-C  ondansetron  (ZOFRAN -ODT) 4 MG disintegrating tablet Take 1 tablet (4 mg total) by mouth every 8 (eight) hours as needed for nausea or vomiting. 06/29/24   Neysa Caron PARAS, DO  pantoprazole  (PROTONIX ) 20 MG tablet Take 1 tablet (20 mg total) by mouth daily for 14 days. 06/29/24 07/13/24  Neysa Caron PARAS, DO  polyethylene glycol (MIRALAX  MIX-IN PAX) 17 g packet Take 17 g by mouth daily as needed. 04/03/23   Arloa Suzen RAMAN, NP  Vitamin D, Cholecalciferol, 25 MCG (1000 UT) CAPS Take 1 tablet by mouth daily. 04/24/23   [provider]    Allergies: Tramadol    Review of Systems  Constitutional:  Negative for fever.  Musculoskeletal:  Positive for arthralgias. Negative for  gait problem and joint swelling.  All other systems reviewed and are negative.   Updated Vital Signs BP 113/79 (BP Location: Left Arm)   Pulse 79   Temp 98.2 F (36.8 C) (Oral)   Resp 18   SpO2 99%   Physical Exam Vitals and nursing note reviewed.  Constitutional:      General: He is not in acute distress.    Appearance: Normal appearance. He is not ill-appearing.  HENT:     Head: Normocephalic and atraumatic.     Nose: Nose normal.  Eyes:     Conjunctiva/sclera: Conjunctivae normal.  Pulmonary:     Effort: Pulmonary effort is normal. No respiratory distress.  Musculoskeletal:        General: No deformity.     Comments: Patient ambulates  without difficulty.  Good strength in bilateral lower extremities in all major joints.  Skin:    Findings: No rash.  Neurological:     Mental Status: He is alert.     (all labs ordered are listed, but only abnormal results are displayed) Labs Reviewed - No data to display  EKG: None  Radiology: DG Hip Unilat With Pelvis 2-3 Views Left Result Date: 07/18/2024 CLINICAL DATA:  pain EXAM: DG HIP (WITH OR WITHOUT PELVIS) 2-3V LEFT COMPARISON:  June 29, 2024 FINDINGS: No evidence of pelvic fracture or diastasis.No acute hip fracture or dislocation.Moderate joint space loss of the left hip with osteophyte formation. The right hip arthroplasty is anatomically aligned without dislocation. Soft tissues are unremarkable. IMPRESSION: 1. No acute fracture, pelvic bone diastasis, or dislocation. 2. Moderate osteoarthritis of the left hip. Electronically Signed   By: Rogelia Myers M.D.   On: 07/18/2024 19:15     Procedures   Medications Ordered in the ED  ketorolac  (TORADOL ) 15 MG/ML injection 15 mg (has no administration in time range)                                    Medical Decision Making Amount and/or Complexity of Data Reviewed Radiology: ordered.  Risk Prescription drug management.   53 year old male presents today for concern of left hip pain.  Previous history of right hip replacement.  X-ray obtained.  Shows some degenerative changes no acute concern. Patient discharged in stable condition. Supportive care discussed. He has an orthopedist that he will follow-up with.   Final diagnoses:  Left hip pain    ED Discharge Orders     None          Hildegard Loge, PA-C 07/20/24 1818    Francesca Elsie CROME, MD 07/24/24 1113

## 2024-07-18 NOTE — ED Triage Notes (Signed)
 Pt has c/o left hip pain- has right hip replacement and was told he would need the left done also. Pt states the pain is bad enough now that he wants to get it fixed.

## 2024-10-13 ENCOUNTER — Ambulatory Visit (HOSPITAL_COMMUNITY)
Admission: EM | Admit: 2024-10-13 | Discharge: 2024-10-13 | Disposition: A | Discharge status: Home / Self Care | Attending: Emergency Medicine | Admitting: Emergency Medicine

## 2024-10-13 ENCOUNTER — Encounter (HOSPITAL_COMMUNITY): Payer: Self-pay | Admitting: Emergency Medicine

## 2024-10-13 DIAGNOSIS — J988 Other specified respiratory disorders: Secondary | ICD-10-CM

## 2024-10-13 DIAGNOSIS — B9789 Other viral agents as the cause of diseases classified elsewhere: Secondary | ICD-10-CM | POA: Diagnosis not present

## 2024-10-13 DIAGNOSIS — R051 Acute cough: Secondary | ICD-10-CM

## 2024-10-13 LAB — POCT INFLUENZA A/B
Influenza A, POC: NEGATIVE
Influenza B, POC: NEGATIVE

## 2024-10-13 LAB — POC SOFIA SARS ANTIGEN FIA: SARS Coronavirus 2 Ag: NEGATIVE

## 2024-10-13 NOTE — Discharge Instructions (Signed)
 Your COVID and flu testing are both negative today.  I believe your symptoms are likely related to a viral respiratory illness. You can alternate between 650 mg of Tylenol  and 400 to 600 mg of ibuprofen every 6-8 hours as needed for any pain or fever. If you develop any significant cough or congestion you can take over-the-counter Mucinex  for this. Make sure you are staying hydrated getting plenty of rest. Follow-up with your primary care provider or return here as needed.

## 2024-10-13 NOTE — ED Provider Notes (Signed)
 [facility]    CSN: [id number] Arrival date & time: 10/13/24  1538      History   Chief Complaint No chief complaint on file.   HPI Don Washington is a 53 y.o. male.   Patient presents with mild cough, sore throat, body aches, and mild headache that began upon waking this morning.  Patient denies any chest pain, shortness of breath, nausea, vomiting, diarrhea, and abdominal pain.  Patient denies any known sick exposures.  Patient denies taking any medication for his symptoms.  Patient is requesting a work note.  The history is provided by the patient and medical records.    Past Medical History:  Diagnosis Date   Arthritis    Colon polyps    DDD (degenerative disc disease), lumbar    GERD (gastroesophageal reflux disease)    Hypercholesteremia    Hypertension    Pre-diabetes     There are no active problems to display for this patient.   Past Surgical History:  Procedure Laterality Date   COLONOSCOPY     DRUG INDUCED ENDOSCOPY     TOTAL HIP ARTHROPLASTY         Home Medications    Prior to Admission medications   Medication Sig Start Date End Date Taking? Authorizing Provider  amoxicillin -clavulanate (AUGMENTIN ) 875-125 MG tablet Take 1 tablet by mouth every 12 (twelve) hours. 01/21/24   Johnie Flaming A, NP  aspirin EC 81 MG tablet Take 81 mg by mouth daily. 09/10/23   [provider]  ATORVASTATIN CALCIUM PO Take by mouth.    [provider]  azithromycin  (ZITHROMAX  Z-PAK) 250 MG tablet Take 2 pills (500mg ) first day and one pill (250mg ) the remaining 4 days. 01/21/24   Johnie Flaming A, NP  benzonatate  (TESSALON ) 100 MG capsule Take 1 capsule (100 mg total) by mouth every 8 (eight) hours. 01/21/24   Johnie Flaming A, NP  BUPROPION HCL PO Take by mouth.    [provider]  BUSPIRONE HCL PO Take by mouth.    [provider]  CALCIUM PO Take by mouth.    [provider]  cyclobenzaprine (FLEXERIL) 10  MG tablet Take 20 mg by mouth 3 (three) times daily.    [provider]  diclofenac Sodium (VOLTAREN) 1 % GEL Apply 2 g topically 4 (four) times daily as needed (muscle pain).    [provider]  gabapentin (NEURONTIN) 600 MG tablet Take 1,200 mg by mouth 3 (three) times daily.    [provider]  guaiFENesin -codeine  100-10 MG/5ML syrup Take 5 mLs by mouth 3 (three) times daily as needed for cough. 01/23/24   Smoot, Lauraine LABOR, PA-C  meloxicam (MOBIC) 15 MG tablet Take 15 mg by mouth daily. 07/08/19   [provider]  Multiple Vitamin (MULTIVITAMIN PO) Take by mouth.    [provider]  mupirocin  ointment (BACTROBAN ) 2 % Apply 1 Application topically 2 (two) times daily. 12/23/22   Raspet, Erin K, PA-C  omeprazole (PRILOSEC) 40 MG capsule Take 40 mg by mouth daily.    [provider]  ondansetron  (ZOFRAN -ODT) 4 MG disintegrating tablet Take 1 tablet (4 mg total) by mouth every 8 (eight) hours as needed for nausea or vomiting. 01/23/24   Smoot, Lauraine LABOR, PA-C  ondansetron  (ZOFRAN -ODT) 4 MG disintegrating tablet Take 1 tablet (4 mg total) by mouth every 8 (eight) hours as needed for nausea or vomiting. 06/29/24   Neysa Caron PARAS, DO  pantoprazole  (PROTONIX ) 20 MG tablet  Take 1 tablet (20 mg total) by mouth daily for 14 days. 06/29/24 07/13/24  Neysa Caron PARAS, DO  polyethylene glycol (MIRALAX  MIX-IN PAX) 17 g packet Take 17 g by mouth daily as needed. 04/03/23   Arloa Suzen RAMAN, NP  Vitamin D, Cholecalciferol, 25 MCG (1000 UT) CAPS Take 1 tablet by mouth daily. 04/24/23   [provider]    Family History Family History  Problem Relation Age of Onset   Diabetes Mother     Social History Social History   Tobacco Use   Smoking status: Every Day    Current packs/day: 0.50    Types: Cigarettes   Smokeless tobacco: Never  Vaping Use   Vaping status: Never Used  Substance Use Topics   Alcohol use: Yes    Comment: 16 oz daily   Drug use: Yes     Types: Marijuana     Allergies   Tramadol   Review of Systems Review of Systems  Per HPI  Physical Exam Triage Vital Signs ED Triage Vitals  Encounter Vitals Group     BP 10/13/24 1759 126/78     Girls Systolic BP Percentile --      Girls Diastolic BP Percentile --      Boys Systolic BP Percentile --      Boys Diastolic BP Percentile --      Pulse Rate 10/13/24 1759 76     Resp 10/13/24 1759 15     Temp 10/13/24 1759 97.9 F (36.6 C)     Temp Source 10/13/24 1759 Oral     SpO2 10/13/24 1759 97 %     Weight --      Height --      Head Circumference --      Peak Flow --      Pain Score 10/13/24 1758 8     Pain Loc --      Pain Education --      Exclude from Growth Chart --    No data found.  Updated Vital Signs BP 126/78 (BP Location: Left Arm)   Pulse 76   Temp 97.9 F (36.6 C) (Oral)   Resp 15   SpO2 97%   Visual Acuity Right Eye Distance:   Left Eye Distance:   Bilateral Distance:    Right Eye Near:   Left Eye Near:    Bilateral Near:     Physical Exam Vitals and nursing note reviewed.  Constitutional:      General: He is awake. He is not in acute distress.    Appearance: Normal appearance. He is well-developed and well-groomed. He is not ill-appearing.  HENT:     Right Ear: Tympanic membrane, ear canal and external ear normal.     Left Ear: Tympanic membrane, ear canal and external ear normal.     Nose: Congestion present.     Mouth/Throat:     Mouth: Mucous membranes are moist.     Pharynx: Posterior oropharyngeal erythema present. No oropharyngeal exudate.  Cardiovascular:     Rate and Rhythm: Normal rate and regular rhythm.  Pulmonary:     Effort: Pulmonary effort is normal.     Breath sounds: Normal breath sounds.  Skin:    General: Skin is warm and dry.  Neurological:     Mental Status: He is alert.  Psychiatric:        Behavior: Behavior is cooperative.      UC Treatments / Results  Labs (all labs ordered are listed,  but  only abnormal results are displayed) Labs Reviewed  POCT INFLUENZA A/B  POC SOFIA SARS ANTIGEN FIA    EKG   Radiology No results found.  Procedures Procedures (including critical care time)  Medications Ordered in UC Medications - No data to display  Initial Impression / Assessment and Plan / UC Course  I have reviewed the triage vital signs and the nursing notes.  Pertinent labs & imaging results that were available during my care of the patient were reviewed by me and considered in my medical decision making (see chart for details).     Patient is overall well-appearing.  Vitals are stable.  Mild congestion noted on exam, mild erythema noted to posterior oropharynx.  Lungs clear bilaterally.  COVID and flu test negative in clinic today.  Symptoms likely viral in nature.  Discussed over-the-counter medication for symptoms.  Provided patient with work note as requested.  Discussed follow-up and return precautions. Final Clinical Impressions(s) / UC Diagnoses   Final diagnoses:  Acute cough  Viral respiratory illness     Discharge Instructions      Your COVID and flu testing are both negative today.  I believe your symptoms are likely related to a viral respiratory illness. You can alternate between 650 mg of Tylenol  and 400 to 600 mg of ibuprofen every 6-8 hours as needed for any pain or fever. If you develop any significant cough or congestion you can take over-the-counter Mucinex  for this. Make sure you are staying hydrated getting plenty of rest. Follow-up with your primary care provider or return here as needed.   ED Prescriptions   None    PDMP not reviewed this encounter.   Johnie Flaming A, NP 10/13/24 [phone]

## 2024-10-13 NOTE — ED Triage Notes (Signed)
 Pt woke up today with headache, body aches, throat hurting and mild cough. Pt report hx URI and states fells similar. Hasn't taken any thing for pains.

## 2024-12-31 ENCOUNTER — Encounter (HOSPITAL_COMMUNITY): Payer: Self-pay | Admitting: *Deleted

## 2024-12-31 ENCOUNTER — Other Ambulatory Visit: Payer: Self-pay

## 2024-12-31 ENCOUNTER — Emergency Department (HOSPITAL_COMMUNITY)
Admission: EM | Admit: 2024-12-31 | Discharge: 2024-12-31 | Disposition: A | Discharge status: Home / Self Care | Attending: Emergency Medicine | Admitting: Emergency Medicine

## 2024-12-31 ENCOUNTER — Emergency Department (HOSPITAL_COMMUNITY)

## 2024-12-31 DIAGNOSIS — R1032 Left lower quadrant pain: Secondary | ICD-10-CM | POA: Insufficient documentation

## 2024-12-31 DIAGNOSIS — Z5321 Procedure and treatment not carried out due to patient leaving prior to being seen by health care provider: Secondary | ICD-10-CM | POA: Insufficient documentation

## 2024-12-31 DIAGNOSIS — R519 Headache, unspecified: Secondary | ICD-10-CM | POA: Insufficient documentation

## 2024-12-31 LAB — COMPREHENSIVE METABOLIC PANEL WITH GFR
ALT: 26 U/L (ref 0–44)
AST: 32 U/L (ref 15–41)
Albumin: 4.5 g/dL (ref 3.5–5.0)
Alkaline Phosphatase: 85 U/L (ref 38–126)
Anion gap: 11 (ref 5–15)
BUN: 11 mg/dL (ref 6–20)
CO2: 24 mmol/L (ref 22–32)
Calcium: 9.1 mg/dL (ref 8.9–10.3)
Chloride: 103 mmol/L (ref 98–111)
Creatinine, Ser: 0.81 mg/dL (ref 0.61–1.24)
GFR, Estimated: >60 mL/min
Glucose, Bld: 73 mg/dL (ref 70–99)
Potassium: 4.5 mmol/L (ref 3.5–5.1)
Sodium: 138 mmol/L (ref 135–145)
Total Bilirubin: 0.3 mg/dL (ref 0.0–1.2)
Total Protein: 7.4 g/dL (ref 6.5–8.1)

## 2024-12-31 LAB — CBC WITH DIFFERENTIAL/PLATELET
Abs Immature Granulocytes: 0.01 10*3/uL (ref 0.00–0.07)
Basophils Absolute: 0 10*3/uL (ref 0.0–0.1)
Basophils Relative: 1 %
Eosinophils Absolute: 0.2 10*3/uL (ref 0.0–0.5)
Eosinophils Relative: 3 %
HCT: 42.5 % (ref 39.0–52.0)
Hemoglobin: 14 g/dL (ref 13.0–17.0)
Immature Granulocytes: 0 %
Lymphocytes Relative: 51 %
Lymphs Abs: 2.7 10*3/uL (ref 0.7–4.0)
MCH: 32.3 pg (ref 26.0–34.0)
MCHC: 32.9 g/dL (ref 30.0–36.0)
MCV: 97.9 fL (ref 80.0–100.0)
Monocytes Absolute: 0.5 10*3/uL (ref 0.1–1.0)
Monocytes Relative: 10 %
Neutro Abs: 1.8 10*3/uL (ref 1.7–7.7)
Neutrophils Relative %: 35 %
Platelets: 131 10*3/uL — ABNORMAL LOW (ref 150–400)
RBC: 4.34 MIL/uL (ref 4.22–5.81)
RDW: 13.8 % (ref 11.5–15.5)
WBC: 5.2 10*3/uL (ref 4.0–10.5)
nRBC: 0 % (ref 0.0–0.2)

## 2024-12-31 LAB — I-STAT CHEM 8, ED
BUN: 11 mg/dL (ref 6–20)
Calcium, Ion: 1.16 mmol/L (ref 1.15–1.40)
Chloride: 102 mmol/L (ref 98–111)
Creatinine, Ser: 0.9 mg/dL (ref 0.61–1.24)
Glucose, Bld: 73 mg/dL (ref 70–99)
HCT: 44 % (ref 39.0–52.0)
Hemoglobin: 15 g/dL (ref 13.0–17.0)
Potassium: 3.9 mmol/L (ref 3.5–5.1)
Sodium: 141 mmol/L (ref 135–145)
TCO2: 24 mmol/L (ref 22–32)

## 2024-12-31 LAB — LIPASE, BLOOD: Lipase: 88 U/L — ABNORMAL HIGH (ref 11–51)

## 2024-12-31 MED ORDER — ACETAMINOPHEN 500 MG PO TABS
1000.0000 mg | ORAL_TABLET | Freq: Once | ORAL | Status: AC
  Administered 2024-12-31: 1000 mg via ORAL
  Filled 2024-12-31: qty 2

## 2024-12-31 MED ORDER — IOHEXOL 350 MG/ML SOLN
100.0000 mL | Freq: Once | INTRAVENOUS | Status: AC | PRN
  Administered 2024-12-31: 100 mL via INTRAVENOUS

## 2024-12-31 NOTE — ED Notes (Signed)
 Pt complaining of 10/10 headache and requesting pain medication. Tylenol  ordered and administered

## 2024-12-31 NOTE — ED Triage Notes (Signed)
 Pt reports headache and LLQ abdominal pain with nausea, denies vomiting that started 2 weeks ago.

## 2024-12-31 NOTE — ED Notes (Signed)
 Patient stated he was leaving, he had to go to work in morning, if he feel any worst he will come back.

## 2024-12-31 NOTE — ED Provider Triage Note (Signed)
 Emergency Medicine Provider Triage Evaluation Note  Don Washington , a 54 y.o. male  was evaluated in triage.  Pt complains of left sided temporal headache that started 2 weeks ago. Pain is fairly constant. No photosensitivity, nausea, vomiting, vision changes, or fever. No neck pain. No head trauma.   Also reports intermittent LLQ abdominal pain for the past 2 months. Last BM this morning. No changes to this pain today. Nothing seems to make it better or worse but he does report constipation history  Review of Systems  Positive: As above Negative: As above  Physical Exam  BP 115/77 (BP Location: Left Arm)   Pulse 73   Temp 97.6 F (36.4 C)   Resp 16   SpO2 99%  Gen:   Awake, no distress   Resp:  Normal effort  MSK:   Moves extremities without difficulty  Other:  Abdomen soft and non-tender. No neuro deficits  Medical Decision Making  Medically screening exam initiated at 2:56 PM.  Appropriate orders placed.  Don Washington was informed that the remainder of the evaluation will be completed by another provider, this initial triage assessment does not replace that evaluation, and the importance of remaining in the ED until their evaluation is complete.     Veta Palma, PA-C 12/31/24 1456
